# Patient Record
Sex: Male | Born: 1965 | Race: White | Hispanic: No | Marital: Married | Smoking: Never smoker
Health system: Southern US, Community
[De-identification: ages and names within clinical notes are randomized; demographics above are authoritative.]

## PROBLEM LIST (undated history)

## (undated) DIAGNOSIS — Z8719 Personal history of other diseases of the digestive system: Secondary | ICD-10-CM

## (undated) DIAGNOSIS — M1990 Unspecified osteoarthritis, unspecified site: Secondary | ICD-10-CM

## (undated) DIAGNOSIS — E785 Hyperlipidemia, unspecified: Secondary | ICD-10-CM

## (undated) DIAGNOSIS — K2270 Barrett's esophagus without dysplasia: Secondary | ICD-10-CM

## (undated) HISTORY — PX: INGUINAL HERNIA REPAIR: SHX194

## (undated) HISTORY — DX: Hyperlipidemia, unspecified: E78.5

## (undated) HISTORY — PX: KNEE SURGERY: SHX244

## (undated) HISTORY — PX: TONSILLECTOMY AND ADENOIDECTOMY: SUR1326

## (undated) HISTORY — PX: COLONOSCOPY: SHX174

## (undated) HISTORY — PX: OTHER SURGICAL HISTORY: SHX169

## (undated) HISTORY — DX: Barrett's esophagus without dysplasia: K22.70

---

## 2004-09-29 ENCOUNTER — Ambulatory Visit: Payer: Self-pay | Admitting: Internal Medicine

## 2004-11-03 ENCOUNTER — Ambulatory Visit: Payer: Self-pay | Admitting: Internal Medicine

## 2007-09-11 ENCOUNTER — Ambulatory Visit: Payer: Self-pay | Admitting: Family Medicine

## 2007-10-15 ENCOUNTER — Ambulatory Visit: Payer: Self-pay | Admitting: Internal Medicine

## 2007-10-17 LAB — CONVERTED CEMR LAB
Mumps IgG: 1.58 — ABNORMAL HIGH
Rubeola IgG: 2.51 — ABNORMAL HIGH

## 2007-10-18 ENCOUNTER — Encounter: Payer: Self-pay | Admitting: Internal Medicine

## 2007-10-18 ENCOUNTER — Telehealth (INDEPENDENT_AMBULATORY_CARE_PROVIDER_SITE_OTHER): Payer: Self-pay | Admitting: *Deleted

## 2007-10-22 ENCOUNTER — Encounter (INDEPENDENT_AMBULATORY_CARE_PROVIDER_SITE_OTHER): Payer: Self-pay | Admitting: *Deleted

## 2007-10-22 LAB — CONVERTED CEMR LAB: Varicella IgG: 4.12 — ABNORMAL HIGH

## 2008-01-23 ENCOUNTER — Ambulatory Visit: Payer: Self-pay | Admitting: Internal Medicine

## 2008-01-23 DIAGNOSIS — R319 Hematuria, unspecified: Secondary | ICD-10-CM | POA: Insufficient documentation

## 2008-01-23 DIAGNOSIS — R1032 Left lower quadrant pain: Secondary | ICD-10-CM

## 2008-01-23 LAB — CONVERTED CEMR LAB
Ketones, urine, test strip: NEGATIVE
Nitrite: NEGATIVE
Specific Gravity, Urine: 1.01
Urobilinogen, UA: 0.2
pH: 6

## 2008-01-24 ENCOUNTER — Encounter: Payer: Self-pay | Admitting: Internal Medicine

## 2008-01-25 ENCOUNTER — Encounter: Payer: Self-pay | Admitting: Internal Medicine

## 2008-01-26 LAB — CONVERTED CEMR LAB
Basophils Absolute: 0.1 10*3/uL (ref 0.0–0.1)
Basophils Relative: 0.8 % (ref 0.0–3.0)
Eosinophils Absolute: 0.1 10*3/uL (ref 0.0–0.7)
Lymphocytes Relative: 40 % (ref 12.0–46.0)
MCHC: 34.7 g/dL (ref 30.0–36.0)
MCV: 87.9 fL (ref 78.0–100.0)
Neutrophils Relative %: 48.6 % (ref 43.0–77.0)
Platelets: 258 10*3/uL (ref 150–400)
RBC: 4.88 M/uL (ref 4.22–5.81)
RDW: 11.8 % (ref 11.5–14.6)

## 2008-01-29 ENCOUNTER — Encounter (INDEPENDENT_AMBULATORY_CARE_PROVIDER_SITE_OTHER): Payer: Self-pay | Admitting: *Deleted

## 2008-01-31 ENCOUNTER — Encounter (INDEPENDENT_AMBULATORY_CARE_PROVIDER_SITE_OTHER): Payer: Self-pay | Admitting: *Deleted

## 2008-01-31 LAB — CONVERTED CEMR LAB: Varicella IgG: 3.47 — ABNORMAL HIGH

## 2008-02-20 ENCOUNTER — Telehealth: Payer: Self-pay | Admitting: Internal Medicine

## 2008-02-22 ENCOUNTER — Ambulatory Visit: Payer: Self-pay | Admitting: Internal Medicine

## 2008-02-27 ENCOUNTER — Encounter (INDEPENDENT_AMBULATORY_CARE_PROVIDER_SITE_OTHER): Payer: Self-pay | Admitting: *Deleted

## 2008-03-17 ENCOUNTER — Encounter: Payer: Self-pay | Admitting: Internal Medicine

## 2008-03-20 ENCOUNTER — Encounter: Admission: RE | Admit: 2008-03-20 | Discharge: 2008-03-20 | Payer: Self-pay | Admitting: General Surgery

## 2008-03-24 ENCOUNTER — Encounter: Payer: Self-pay | Admitting: Internal Medicine

## 2008-06-25 ENCOUNTER — Encounter: Payer: Self-pay | Admitting: Internal Medicine

## 2008-11-03 ENCOUNTER — Ambulatory Visit: Payer: Self-pay | Admitting: Internal Medicine

## 2008-11-05 ENCOUNTER — Ambulatory Visit: Payer: Self-pay | Admitting: Internal Medicine

## 2009-08-28 ENCOUNTER — Encounter: Payer: Self-pay | Admitting: Internal Medicine

## 2009-09-03 ENCOUNTER — Encounter: Payer: Self-pay | Admitting: Internal Medicine

## 2009-09-21 ENCOUNTER — Encounter: Payer: Self-pay | Admitting: Internal Medicine

## 2009-09-28 ENCOUNTER — Encounter: Payer: Self-pay | Admitting: Internal Medicine

## 2010-02-04 NOTE — Letter (Signed)
Summary: [facility]  [facility]   Imported By: Lanelle Bal 09/15/2009 08:33:43  _____________________________________________________________________  External Attachment:    Type:   Image     Comment:   External Document

## 2010-02-04 NOTE — Letter (Signed)
Summary: [facility]   Imported By: Lanelle Bal 09/08/2009 09:57:45  _____________________________________________________________________  External Attachment:    Type:   Image     Comment:   External Document

## 2010-02-04 NOTE — Procedures (Signed)
Summary: [facility]  [facility]   Imported By: Lanelle Bal 10/14/2009 13:28:59  _____________________________________________________________________  External Attachment:    Type:   Image     Comment:   External Document

## 2010-02-04 NOTE — Letter (Signed)
Summary: [facility]  [facility]   Imported By: Lanelle Bal 10/01/2009 12:04:38  _____________________________________________________________________  External Attachment:    Type:   Image     Comment:   External Document

## 2010-02-11 ENCOUNTER — Encounter: Payer: Self-pay | Admitting: Internal Medicine

## 2010-02-11 ENCOUNTER — Ambulatory Visit: Payer: Self-pay | Admitting: Internal Medicine

## 2010-02-11 ENCOUNTER — Ambulatory Visit (INDEPENDENT_AMBULATORY_CARE_PROVIDER_SITE_OTHER): Payer: 59 | Admitting: Internal Medicine

## 2010-02-11 DIAGNOSIS — G479 Sleep disorder, unspecified: Secondary | ICD-10-CM

## 2010-02-18 NOTE — Assessment & Plan Note (Signed)
Summary: discuss  sleep apnea///sph   Vital Signs:  Patient profile:   45 year old male Weight:      231 pounds Temp:     98.2 degrees F oral Pulse rate:   76 / minute Resp:     15 per minute BP sitting:   120 / 76  (left arm)  Vitals Entered By: Jeremy Johann CMA (February 11, 2010 4:15 PM) CC: discuss sleep issue, Insomnia   CC:  discuss sleep issue and Insomnia.  History of Present Illness:      This is a 45 year old man who presents with  chronic sleep issues since childhood.  The patient reports frequent awakening ( 10-12 X /night), early awakening ( 30 min after going to sleep), nightmares (he awakens in a" panic" 2-3x every night  ), snoring, and daytime somnolence, but denies difficulty falling asleep, leg movements, and apnea noted by partner.  He has had  weight gain of 10# over past year. He has not been able to exercise due to Achilles tendon issues. He denies  am  headache, and depression.  Risk factors for insomnia include history of  sleep walking & talking in sleep. No past treatments  have been tried.  Current Medications (verified): 1)  Kapidex 60 Mg Cpdr (Dexlansoprazole) .Marland Kitchen.. 1 Once Daily  Allergies (verified): No Known Drug Allergies  Physical Exam  General:  well-nourished,in no acute distress; alert,appropriate and cooperative throughout examination Ears:  External ear exam shows no significant lesions or deformities.  Otoscopic examination reveals clear canals, tympanic membranes are intact bilaterally without bulging, retraction, inflammation or discharge. Hearing is grossly normal bilaterally. Nose:  External nasal examination shows no deformity or inflammation. Nasal mucosa are pink and moist without lesions or exudates. Mouth:  Oral mucosa and oropharynx without lesions or exudates.  Teeth in good repair. Oropharynx not crowded Neck:  No deformities, masses, or tenderness noted. Lungs:  Normal respiratory effort, chest expands symmetrically. Lungs are  clear to auscultation, no crackles or wheezes. Heart:  normal rate, regular rhythm, no gallop, no rub, no JVD, and grade 1 /6 systolic murmur.   Cervical Nodes:  No lymphadenopathy noted Axillary Nodes:  No palpable lymphadenopathy   Impression & Recommendations:  Problem # 1:  SLEEP DISORDER, CHRONIC (ICD-780.50)  clinicaly not classic Sleep Apnea  Orders: Neurology Referral (Neuro)  Complete Medication List: 1)  Kapidex 60 Mg Cpdr (Dexlansoprazole) .Marland Kitchen.. 1 once daily   Orders Added: 1)  Est. Patient Level III [16109] 2)  Neurology Referral [Neuro]

## 2010-04-05 ENCOUNTER — Encounter: Payer: Self-pay | Admitting: Internal Medicine

## 2010-06-13 ENCOUNTER — Observation Stay (HOSPITAL_COMMUNITY)
Admission: EM | Admit: 2010-06-13 | Discharge: 2010-06-14 | Disposition: A | Discharge status: Home / Self Care | Payer: 59 | Attending: Emergency Medicine | Admitting: Emergency Medicine

## 2010-06-13 ENCOUNTER — Emergency Department (HOSPITAL_COMMUNITY): Payer: 59

## 2010-06-13 DIAGNOSIS — R079 Chest pain, unspecified: Principal | ICD-10-CM | POA: Insufficient documentation

## 2010-06-13 DIAGNOSIS — R110 Nausea: Secondary | ICD-10-CM | POA: Insufficient documentation

## 2010-06-13 DIAGNOSIS — R0602 Shortness of breath: Secondary | ICD-10-CM | POA: Insufficient documentation

## 2010-06-13 LAB — CBC
HCT: 39.6 % (ref 39.0–52.0)
Platelets: 230 10*3/uL (ref 150–400)
RDW: 12.5 % (ref 11.5–15.5)
WBC: 5.9 10*3/uL (ref 4.0–10.5)

## 2010-06-13 LAB — BASIC METABOLIC PANEL
Calcium: 8.7 mg/dL (ref 8.4–10.5)
Creatinine, Ser: 0.86 mg/dL (ref 0.4–1.5)
GFR calc Af Amer: >60 mL/min (ref >60)
GFR calc non Af Amer: >60 mL/min (ref >60)
Sodium: 138 mEq/L (ref 135–145)

## 2010-06-13 LAB — TROPONIN I: Troponin I: <0.3 ng/mL (ref <0.30)

## 2010-06-13 LAB — CK TOTAL AND CKMB (NOT AT ARMC)
Relative Index: 1.7 (ref 0.0–2.5)
Total CK: 152 U/L (ref 7–232)

## 2010-06-13 LAB — DIFFERENTIAL
Basophils Absolute: 0 10*3/uL (ref 0.0–0.1)
Eosinophils Absolute: 0.1 10*3/uL (ref 0.0–0.7)
Eosinophils Relative: 2 % (ref 0–5)
Lymphocytes Relative: 41 % (ref 12–46)

## 2010-06-13 MED ORDER — IOHEXOL 350 MG/ML SOLN
100.0000 mL | Freq: Once | INTRAVENOUS | Status: AC | PRN
  Filled 2010-06-13: qty 100

## 2010-06-14 ENCOUNTER — Telehealth: Payer: Self-pay | Admitting: *Deleted

## 2010-06-14 DIAGNOSIS — R072 Precordial pain: Secondary | ICD-10-CM

## 2010-06-14 LAB — TROPONIN I: Troponin I: <0.3 ng/mL (ref <0.30)

## 2010-06-14 LAB — CK TOTAL AND CKMB (NOT AT ARMC)
CK, MB: 2.3 ng/mL (ref 0.3–4.0)
Total CK: 130 U/L (ref 7–232)

## 2010-06-14 NOTE — Telephone Encounter (Signed)
[facility] Triage Call Report Triage Record Num: [id number] Operator: Di Kindle Patient Name: Randy Arnold Call Date & Time: 06/13/2010 2:58:54PM Patient Phone: [phone] PCP: Marga Melnick Patient Gender: Male PCP Fax : [phone] Patient DOB: September 26, 1965 Practice Name: [facility] Reason for Call: Emergenct call, pt calling to report onset 06/12/10 of chest pain, while playing basketball , had some chest pains, radiates to shoulder, feels he still has a headacehe, sl nauseated, still light headed and dizzy. Per CSR " the pt refused 911", Guideline: Chest Pain, family history of CAD, advised needs to be seen in ED: [facility]. Protocol(s) Used: Chest Pain Recommended Outcome per Protocol: See ED Immediately Reason for Outcome: Cardiac symptoms within last 24 hours AND known coronary artery disease (history of MI, angina, PCI or cardiac surgery) Care Advice: ~ Another adult should drive. A pattern of pain that has changed or feels different (more frequent, more severe, now caused

## 2010-06-14 NOTE — Telephone Encounter (Signed)
Left message to call office to check status of Pt. Pt seen in ED on 06-13-10

## 2010-06-15 NOTE — Telephone Encounter (Addendum)
Pt was seen in ED and stayed over night for observation. Pt had cardiac workup while in hosp. Pt notes that he still does not feel 100%. Pt has hosp f/u schedule for Thursday with Dr hopper.

## 2010-06-17 ENCOUNTER — Encounter: Payer: Self-pay | Admitting: Internal Medicine

## 2010-06-17 ENCOUNTER — Ambulatory Visit (INDEPENDENT_AMBULATORY_CARE_PROVIDER_SITE_OTHER): Payer: 59 | Admitting: Internal Medicine

## 2010-06-17 DIAGNOSIS — K2270 Barrett's esophagus without dysplasia: Secondary | ICD-10-CM

## 2010-06-17 DIAGNOSIS — R079 Chest pain, unspecified: Secondary | ICD-10-CM

## 2010-06-17 MED ORDER — TRAMADOL HCL 50 MG PO TABS: 50.0000 mg | ORAL_TABLET | Freq: Four times a day (QID) | ORAL | Status: AC | PRN

## 2010-06-17 NOTE — Patient Instructions (Addendum)
The triggers for dyspepsia or "heart burn"  include stress; the "aspirin family" ; alcohol; peppermint; and caffeine (coffee, tea, cola, and chocolate). The aspirin family would include aspirin and the nonsteroidal agents such as ibuprofen &  Naproxen. Tylenol would not cause reflux. If having dyspepsia ; food & dink should be avoided for @ least 2 hors before going to bed.  Stop Aleve

## 2010-06-17 NOTE — Progress Notes (Signed)
  Subjective:    Patient ID: Randy Arnold, male    DOB: Dec 05, 1965, 45 y.o.   MRN: [id number]  HPI CHEST PAIN  Location: SS  Quality: dull  Duration:constant since 6/9  Onset (rest, exertion): while playing BB Radiation: to back intrascapularly  Better with: no reliever (Note : he takes 4-5 Aleve every 48 hrs)  Worse with: no trigger  Symptoms  Nausea/vomiting: no  Diaphoresis: no  Shortness of breath: yes  Pleuritic: yes  Cough: no  Edema: no  Orthopnea: no  PND: no  Dizziness: no  Palpitations: no  Syncope: no  Indigestion: no   Red Flags Worse with exertion: yes, he had to stop playing BB  Recent Immobility: no  Tearing/radiation to back: yes, as dull discomfort Stress ECHO neg 6/11    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  Eyes: No conjunctival inflammation or scleral icterus is present.  Oral exam: Dental hygiene is good; lips and gums are healthy appearing.There is no oropharyngeal erythema or exudate noted.   Heart:  Normal rate and regular rhythm. S1 and S2 normal without gallop,  click, rub or other extra sounds  . Grade 1/2 systolic murmur   Lungs:Chest clear to auscultation; no wheezes, rhonchi,rales ,or rubs present.No increased work of breathing.   Abdomen: bowel sounds normal, soft and non-tender without masses, organomegaly or hernias noted.  No guarding or rebound   Skin:Warm & dry.  Intact without suspicious lesions or rashes ; no jaundice or tenting  Lymphatic: No lymphadenopathy is noted about the head, neck, axilla, or inguinal areas.  Extremities:  No cyanosis, edema, or deformity noted ;Homan's negative           Assessment & Plan:  #1 chest pain, ? Esophageal spasm exacerbated by NSAIDS; ? Role of HH Plan: see  Orders

## 2010-07-16 ENCOUNTER — Telehealth: Payer: Self-pay

## 2010-07-16 MED ORDER — ZOLPIDEM TARTRATE 10 MG PO TABS: ORAL_TABLET | ORAL | Status: DC

## 2010-07-16 NOTE — Telephone Encounter (Signed)
Patient's wife stopped by the office today and indicated her and husband will travel out of the country soon and would like rx for sleep aid for the plane ride, Dr.Hopper please advise. [facility] [place]

## 2012-08-27 HISTORY — PX: UPPER GI ENDOSCOPY: SHX6162

## 2012-09-09 ENCOUNTER — Encounter: Payer: Self-pay | Admitting: Internal Medicine

## 2013-09-30 ENCOUNTER — Telehealth: Payer: Self-pay | Admitting: Internal Medicine

## 2013-09-30 NOTE — Telephone Encounter (Signed)
Called pt back wanted to know the date of last MMR & varcella. Inform pt no varcella was done. Left copy of immunization record for pick-up...Randy Arnold

## 2013-09-30 NOTE — Telephone Encounter (Signed)
Patient is needing verification of vaccinations for [facility].

## 2013-10-15 ENCOUNTER — Ambulatory Visit (INDEPENDENT_AMBULATORY_CARE_PROVIDER_SITE_OTHER): Payer: Managed Care, Other (non HMO) | Admitting: Internal Medicine

## 2013-10-15 ENCOUNTER — Other Ambulatory Visit (INDEPENDENT_AMBULATORY_CARE_PROVIDER_SITE_OTHER): Payer: Managed Care, Other (non HMO)

## 2013-10-15 ENCOUNTER — Other Ambulatory Visit: Payer: Self-pay | Admitting: Internal Medicine

## 2013-10-15 ENCOUNTER — Encounter: Payer: Self-pay | Admitting: Internal Medicine

## 2013-10-15 VITALS — BP 104/80 | HR 63 | Temp 98.7°F | Resp 12 | Wt 228.2 lb

## 2013-10-15 DIAGNOSIS — Z0000 Encounter for general adult medical examination without abnormal findings: Secondary | ICD-10-CM

## 2013-10-15 DIAGNOSIS — Z0189 Encounter for other specified special examinations: Secondary | ICD-10-CM

## 2013-10-15 DIAGNOSIS — Z23 Encounter for immunization: Secondary | ICD-10-CM

## 2013-10-15 DIAGNOSIS — E785 Hyperlipidemia, unspecified: Secondary | ICD-10-CM

## 2013-10-15 LAB — CBC WITH DIFFERENTIAL/PLATELET
BASOS ABS: 0 10*3/uL (ref 0.0–0.1)
Basophils Relative: 0.5 % (ref 0.0–3.0)
EOS ABS: 0.1 10*3/uL (ref 0.0–0.7)
Eosinophils Relative: 2 % (ref 0.0–5.0)
HCT: 46.9 % (ref 39.0–52.0)
Hemoglobin: 15.8 g/dL (ref 13.0–17.0)
LYMPHS PCT: 34.3 % (ref 12.0–46.0)
Lymphs Abs: 2.3 10*3/uL (ref 0.7–4.0)
MCHC: 33.7 g/dL (ref 30.0–36.0)
MCV: 87.9 fl (ref 78.0–100.0)
MONO ABS: 0.6 10*3/uL (ref 0.1–1.0)
Monocytes Relative: 9.4 % (ref 3.0–12.0)
NEUTROS ABS: 3.6 10*3/uL (ref 1.4–7.7)
NEUTROS PCT: 53.8 % (ref 43.0–77.0)
Platelets: 285 10*3/uL (ref 150.0–400.0)
RBC: 5.34 Mil/uL (ref 4.22–5.81)
RDW: 13.3 % (ref 11.5–15.5)
WBC: 6.7 10*3/uL (ref 4.0–10.5)

## 2013-10-15 LAB — HEPATIC FUNCTION PANEL
ALBUMIN: 3.8 g/dL (ref 3.5–5.2)
ALK PHOS: 59 U/L (ref 39–117)
ALT: 29 U/L (ref 0–53)
AST: 20 U/L (ref 0–37)
Bilirubin, Direct: 0.1 mg/dL (ref 0.0–0.3)
TOTAL PROTEIN: 7.7 g/dL (ref 6.0–8.3)
Total Bilirubin: 0.7 mg/dL (ref 0.2–1.2)

## 2013-10-15 LAB — BASIC METABOLIC PANEL
BUN: 16 mg/dL (ref 6–23)
CALCIUM: 9.4 mg/dL (ref 8.4–10.5)
CHLORIDE: 102 meq/L (ref 96–112)
CO2: 29 meq/L (ref 19–32)
CREATININE: 1.1 mg/dL (ref 0.4–1.5)
GFR: 78.37 mL/min (ref >60.00)
GLUCOSE: 107 mg/dL — AB (ref 70–99)
Potassium: 4.4 mEq/L (ref 3.5–5.1)
Sodium: 139 mEq/L (ref 135–145)

## 2013-10-15 LAB — TSH: TSH: 2.14 u[IU]/mL (ref 0.35–4.50)

## 2013-10-15 NOTE — Patient Instructions (Signed)
Your next office appointment will be determined based upon review of your pending labs . Those instructions will be transmitted to you through My Chart  OR  by mail;whichever process is your choice to receive results & recommendations . 

## 2013-10-15 NOTE — Progress Notes (Signed)
   Subjective:    Patient ID: Randy Arnold, male    DOB: [birth year], 48 y.o.   MRN: [id number]  HPI  He is here for a physical;acute issues denied.  He is on a modified heart healthy diet. He exercises 3-5 times a week as basketball and running without symptoms.  His lipids have not been elevated in the recent past. No advanced panel on record. Paternal grandfather had heart attack at 6042.    Review of Systems Unexplained weight loss, abdominal pain, significant dyspepsia, dysphagia, melena, rectal bleeding, or persistently small caliber stools are denied despite PMH of Barrett's.  Chest pain, palpitations, tachycardia, exertional dyspnea, paroxysmal nocturnal dyspnea, claudication or edema are absent.       Objective:   Physical Exam Gen.: Healthy and well-nourished in appearance. Alert, appropriate and cooperative throughout exam. Head: Normocephalic without obvious abnormalities;  pattern alopecia  Eyes: No corneal or conjunctival inflammation noted. Pupils equal round reactive to light and accommodation. Extraocular motion intact.  Ears: External  ear exam reveals no significant lesions or deformities. Canals clear .TMs normal. Hearing is grossly normal bilaterally. Nose: External nasal exam reveals no deformity or inflammation. Nasal mucosa are pink and moist. No lesions or exudates noted.   Mouth: Oral mucosa and oropharynx reveal no lesions or exudates. Teeth in good repair. Neck: No deformities, masses, or tenderness noted. Range of motion & Thyroid normal. Lungs: Normal respiratory effort; chest expands symmetrically. Lungs are clear to auscultation without rales, wheezes, or increased work of breathing. Heart: Normal rate and rhythm. Normal S1 and S2. No gallop, click, or rub.No murmur. Abdomen: Bowel sounds normal; abdomen soft and nontender. No masses, organomegaly or hernias noted. Genitalia:  normal except for small left varices. Prostate is slightly broad  without  enlargement, asymmetry, nodularity, or induration .Dr Vernie Ammonsttelin has checked PSA                              Musculoskeletal/extremities: No deformity or scoliosis noted of  the thoracic or lumbar spine.  No clubbing, cyanosis, edema, or significant extremity  deformity noted. Range of motion normal .Tone & strength normal. Hand joints normal Fingernail  health good. Able to lie down & sit up w/o help. Negative SLR bilaterally Vascular: Carotid, radial artery, dorsalis pedis and  posterior tibial pulses are full and equal. No bruits present. Neurologic: Alert and oriented x3. Deep tendon reflexes symmetrical and normal.  Gait normal .       Skin: Intact without suspicious lesions or rashes. Lymph: No cervical, axillary, or inguinal lymphadenopathy present. Psych: Mood and affect are normal. Normally interactive                                                                                        Assessment & Plan:  #1 comprehensive physical exam; no acute findings  Plan: see Orders  & Recommendations

## 2013-10-15 NOTE — Progress Notes (Signed)
Pre visit review using our clinic review tool, if applicable. No additional management support is needed unless otherwise documented below in the visit note. 

## 2013-10-17 ENCOUNTER — Telehealth: Payer: Self-pay

## 2013-10-17 ENCOUNTER — Encounter: Payer: Self-pay | Admitting: *Deleted

## 2013-10-17 LAB — NMR LIPOPROFILE WITH LIPIDS
Cholesterol, Total: 267 mg/dL — ABNORMAL HIGH (ref 100–199)
HDL PARTICLE NUMBER: 32.5 umol/L (ref >=30.5)
HDL SIZE: 8.7 nm — AB (ref >=9.2)
HDL-C: 48 mg/dL (ref >39)
LDL CALC: 171 mg/dL — AB (ref 0–99)
LDL PARTICLE NUMBER: 2185 nmol/L — AB (ref <1000)
LDL SIZE: 20.7 nm (ref >=20.8)
LP-IR SCORE: 75 — AB (ref <=45)
Large HDL-P: 3.9 umol/L — ABNORMAL LOW (ref >=4.8)
Large VLDL-P: 10.1 nmol/L — ABNORMAL HIGH (ref <=2.7)
SMALL LDL PARTICLE NUMBER: 1314 nmol/L — AB (ref <=527)
Triglycerides: 240 mg/dL — ABNORMAL HIGH (ref 0–149)
VLDL SIZE: 49.3 nm — AB (ref <=46.6)

## 2013-10-17 LAB — TB SKIN TEST
Induration: 0 mm
TB Skin Test: NEGATIVE

## 2013-10-17 NOTE — Telephone Encounter (Signed)
Message copied by Noreene LarssonANDREWS, Braydee Shimkus R on Thu Oct 17, 2013  8:00 AM ------      Message from: Pecola LawlessHOPPER, WILLIAM F      Created: Wed Oct 16, 2013  5:41 PM       Please add A1c  ------

## 2013-10-17 NOTE — Telephone Encounter (Signed)
Request for add on has been sent to lab 

## 2013-10-18 ENCOUNTER — Other Ambulatory Visit (INDEPENDENT_AMBULATORY_CARE_PROVIDER_SITE_OTHER): Payer: Managed Care, Other (non HMO)

## 2013-10-18 DIAGNOSIS — R739 Hyperglycemia, unspecified: Secondary | ICD-10-CM

## 2013-10-18 LAB — HEMOGLOBIN A1C: HEMOGLOBIN A1C: 5.8 % (ref 4.6–6.5)

## 2013-10-20 ENCOUNTER — Encounter: Payer: Self-pay | Admitting: Internal Medicine

## 2013-10-23 ENCOUNTER — Other Ambulatory Visit: Payer: Self-pay

## 2013-10-23 ENCOUNTER — Encounter: Payer: Self-pay | Admitting: Internal Medicine

## 2013-10-23 ENCOUNTER — Other Ambulatory Visit: Payer: Self-pay | Admitting: Internal Medicine

## 2013-10-23 DIAGNOSIS — E785 Hyperlipidemia, unspecified: Secondary | ICD-10-CM

## 2013-10-23 MED ORDER — ATORVASTATIN CALCIUM 20 MG PO TABS: 20.0000 mg | ORAL_TABLET | Freq: Every day | ORAL | Status: DC

## 2014-03-11 ENCOUNTER — Other Ambulatory Visit: Payer: Self-pay | Admitting: Internal Medicine

## 2014-03-11 DIAGNOSIS — E785 Hyperlipidemia, unspecified: Secondary | ICD-10-CM

## 2014-03-12 ENCOUNTER — Other Ambulatory Visit: Payer: Self-pay | Admitting: Internal Medicine

## 2014-03-12 DIAGNOSIS — E785 Hyperlipidemia, unspecified: Secondary | ICD-10-CM

## 2014-03-12 MED ORDER — KETOCONAZOLE 200 MG PO TABS: 400.0000 mg | ORAL_TABLET | Freq: Every day | ORAL | Status: DC

## 2014-03-12 MED ORDER — ATORVASTATIN CALCIUM 20 MG PO TABS: 20.0000 mg | ORAL_TABLET | Freq: Every day | ORAL | Status: DC

## 2017-04-05 ENCOUNTER — Other Ambulatory Visit: Payer: Self-pay | Admitting: Orthopedic Surgery

## 2017-04-07 ENCOUNTER — Encounter (HOSPITAL_COMMUNITY): Payer: Self-pay

## 2017-04-07 NOTE — Pre-Procedure Instructions (Signed)
Randy Arnold  04/07/2017      [facility] [id number] [place] - [address] [facility] [address] [facility] [place] [address] Phone: [phone] Fax: [phone]  [facility] [facility] [address] [address] [place] - [address] [place] [address] Phone: [phone] Fax: [phone]    Your procedure is scheduled on April 11.  [facility].M.  Call this number if you have problems the morning of surgery:  [phone]   Remember:  Do not eat food or drink liquids after midnight.  Take these medicines the morning of surgery with A SIP OF WATER Lyrica   Stop taking aspirin. BC's, Goody's, Herbal medications, fish oil, Ibuprofen, Advil, Motrin, Aleve, VItamins    Do not wear jewelry, make-up or nail polish.  Do not wear lotions, powders, or perfumes, or deodorant.  Do not shave 48 hours prior to surgery.  Men may shave face and neck.  Do not bring valuables to the hospital.  [facility] is not responsible for any belongings or valuables.  Contacts, dentures or bridgework may not be worn into surgery.  Leave your suitcase in the car.  After surgery it may be brought to your room.  For patients admitted to the hospital, discharge time will be determined by your treatment team.  Patients discharged the day of surgery will not be allowed to drive home.    Special instructions:   [place]- Preparing For Surgery  Before surgery, you can play an important role. Because skin is not sterile, your skin needs to be as free of germs as possible. You can reduce the number of germs on your skin by washing with CHG (chlorahexidine gluconate) Soap before surgery.  CHG is an antiseptic cleaner which kills germs and bonds with the skin to continue killing germs even after washing.  Please do not use if you have an allergy to CHG or antibacterial soaps. If your skin  becomes reddened/irritated stop using the CHG.  Do not shave (including legs and underarms) for at least 48 hours prior to first CHG shower. It is OK to shave your face.  Please follow these instructions carefully.   1. Shower the NIGHT BEFORE SURGERY and the MORNING OF SURGERY with CHG.   2. If you chose to wash your hair, wash your hair first as usual with your normal shampoo.  3. After you shampoo, rinse your hair and body thoroughly to remove the shampoo.  4. Use CHG as you would any other liquid soap. You can apply CHG directly to the skin and wash gently with a scrungie or a clean washcloth.   5. Apply the CHG Soap to your body ONLY FROM THE NECK DOWN.  Do not use on open wounds or open sores. Avoid contact with your eyes, ears, mouth and genitals (private parts). Wash Face and genitals (private parts)  with your normal soap.  6. Wash thoroughly, paying special attention to the area where your surgery will be performed.  7. Thoroughly rinse your body with warm water from the neck down.  8. DO NOT shower/wash with your normal soap after using and rinsing off the CHG Soap.  9. Pat yourself dry with a CLEAN TOWEL.  10. Wear CLEAN PAJAMAS to bed the night before surgery, wear comfortable clothes the morning of surgery  11. Place CLEAN SHEETS on your bed  the night of your first shower and DO NOT SLEEP WITH PETS.    Day of Surgery: Do not apply any deodorants/lotions. Please wear clean clothes to the hospital/surgery center.      Please read over the following fact sheets that you were given. Pain Booklet, Coughing and Deep Breathing, MRSA Information and Surgical Site Infection Prevention

## 2017-04-10 ENCOUNTER — Ambulatory Visit (HOSPITAL_COMMUNITY)
Admission: RE | Admit: 2017-04-10 | Discharge: 2017-04-10 | Disposition: A | Discharge status: Home / Self Care | Payer: 59 | Source: Ambulatory Visit | Attending: Orthopedic Surgery | Admitting: Orthopedic Surgery

## 2017-04-10 ENCOUNTER — Other Ambulatory Visit: Payer: Self-pay

## 2017-04-10 ENCOUNTER — Encounter (HOSPITAL_COMMUNITY)
Admission: RE | Admit: 2017-04-10 | Discharge: 2017-04-10 | Disposition: A | Discharge status: Home / Self Care | Payer: 59 | Source: Ambulatory Visit | Attending: Orthopedic Surgery | Admitting: Orthopedic Surgery

## 2017-04-10 ENCOUNTER — Encounter (HOSPITAL_COMMUNITY): Payer: Self-pay

## 2017-04-10 DIAGNOSIS — Z01810 Encounter for preprocedural cardiovascular examination: Secondary | ICD-10-CM | POA: Diagnosis present

## 2017-04-10 DIAGNOSIS — Z01818 Encounter for other preprocedural examination: Secondary | ICD-10-CM | POA: Diagnosis present

## 2017-04-10 HISTORY — DX: Personal history of other diseases of the digestive system: Z87.19

## 2017-04-10 HISTORY — DX: Unspecified osteoarthritis, unspecified site: M19.90

## 2017-04-10 LAB — URINALYSIS, ROUTINE W REFLEX MICROSCOPIC
BACTERIA UA: NONE SEEN
Bilirubin Urine: NEGATIVE
Glucose, UA: NEGATIVE mg/dL
Ketones, ur: NEGATIVE mg/dL
Leukocytes, UA: NEGATIVE
Nitrite: NEGATIVE
Protein, ur: NEGATIVE mg/dL
SPECIFIC GRAVITY, URINE: 1.008 (ref 1.005–1.030)
SQUAMOUS EPITHELIAL / LPF: NONE SEEN
pH: 6 (ref 5.0–8.0)

## 2017-04-10 LAB — PROTIME-INR
INR: 0.95
Prothrombin Time: 12.6 seconds (ref 11.4–15.2)

## 2017-04-10 LAB — COMPREHENSIVE METABOLIC PANEL
ALBUMIN: 3.9 g/dL (ref 3.5–5.0)
ALT: 40 U/L (ref 17–63)
AST: 24 U/L (ref 15–41)
Alkaline Phosphatase: 58 U/L (ref 38–126)
Anion gap: 14 (ref 5–15)
BILIRUBIN TOTAL: 0.9 mg/dL (ref 0.3–1.2)
BUN: 13 mg/dL (ref 6–20)
CO2: 22 mmol/L (ref 22–32)
CREATININE: 0.98 mg/dL (ref 0.61–1.24)
Calcium: 9.4 mg/dL (ref 8.9–10.3)
Chloride: 101 mmol/L (ref 101–111)
GFR calc Af Amer: >60 mL/min (ref >60)
Glucose, Bld: 107 mg/dL — ABNORMAL HIGH (ref 65–99)
Potassium: 3.9 mmol/L (ref 3.5–5.1)
Sodium: 137 mmol/L (ref 135–145)
TOTAL PROTEIN: 7.1 g/dL (ref 6.5–8.1)

## 2017-04-10 LAB — CBC WITH DIFFERENTIAL/PLATELET
BASOS PCT: 1 %
Basophils Absolute: 0.1 10*3/uL (ref 0.0–0.1)
EOS PCT: 1 %
Eosinophils Absolute: 0.1 10*3/uL (ref 0.0–0.7)
HCT: 44.4 % (ref 39.0–52.0)
HEMOGLOBIN: 15 g/dL (ref 13.0–17.0)
Lymphocytes Relative: 40 %
Lymphs Abs: 2.9 10*3/uL (ref 0.7–4.0)
MCH: 29.8 pg (ref 26.0–34.0)
MCHC: 33.8 g/dL (ref 30.0–36.0)
MCV: 88.3 fL (ref 78.0–100.0)
MONOS PCT: 10 %
Monocytes Absolute: 0.7 10*3/uL (ref 0.1–1.0)
NEUTROS PCT: 48 %
Neutro Abs: 3.6 10*3/uL (ref 1.7–7.7)
PLATELETS: 297 10*3/uL (ref 150–400)
RBC: 5.03 MIL/uL (ref 4.22–5.81)
RDW: 12.9 % (ref 11.5–15.5)
WBC: 7.4 10*3/uL (ref 4.0–10.5)

## 2017-04-10 LAB — SURGICAL PCR SCREEN
MRSA, PCR: NEGATIVE
Staphylococcus aureus: NEGATIVE

## 2017-04-10 LAB — APTT: aPTT: 28 seconds (ref 24–36)

## 2017-04-10 NOTE — Progress Notes (Signed)
PCP: Dr. Antony HasteMichael Badger @ [facility] [place]

## 2017-04-12 ENCOUNTER — Encounter (HOSPITAL_COMMUNITY): Payer: Self-pay | Admitting: Anesthesiology

## 2017-04-12 NOTE — Anesthesia Preprocedure Evaluation (Addendum)
Anesthesia Evaluation  Patient identified by MRN, date of birth, ID band Patient awake    Reviewed: Allergy & Precautions, NPO status , Patient's Chart, lab work & pertinent test results  Airway Mallampati: II  TM Distance: >3 FB Neck ROM: Full    Dental  (+) Teeth Intact, Dental Advisory Given   Pulmonary neg pulmonary ROS,    breath sounds clear to auscultation       Cardiovascular  Rhythm:Regular Rate:Normal     Neuro/Psych negative neurological ROS  negative psych ROS   GI/Hepatic Neg liver ROS, hiatal hernia,   Endo/Other  negative endocrine ROS  Renal/GU negative Renal ROS     Musculoskeletal  (+) Arthritis ,   Abdominal Normal abdominal exam  (+)   Peds  Hematology negative hematology ROS (+)   Anesthesia Other Findings - HLD  Reproductive/Obstetrics                            Lab Results  Component Value Date   WBC 7.4 04/10/2017   HGB 15.0 04/10/2017   HCT 44.4 04/10/2017   MCV 88.3 04/10/2017   PLT 297 04/10/2017   Lab Results  Component Value Date   CREATININE 0.98 04/10/2017   BUN 13 04/10/2017   NA 137 04/10/2017   K 3.9 04/10/2017   CL 101 04/10/2017   CO2 22 04/10/2017   Lab Results  Component Value Date   INR 0.95 04/10/2017   EKG: NSR  Anesthesia Physical Anesthesia Plan  ASA: II  Anesthesia Plan: General   Post-op Pain Management:    Induction: Intravenous  PONV Risk Score and Plan: 3 and Ondansetron, Dexamethasone and Midazolam  Airway Management Planned: Oral ETT  Additional Equipment: None  Intra-op Plan:   Post-operative Plan: Extubation in OR  Informed Consent: I have reviewed the patients History and Physical, chart, labs and discussed the procedure including the risks, benefits and alternatives for the proposed anesthesia with the patient or authorized representative who has indicated his/her understanding and acceptance.   Dental  advisory given  Plan Discussed with: CRNA  Anesthesia Plan Comments:        Anesthesia Quick Evaluation

## 2017-04-13 ENCOUNTER — Ambulatory Visit (HOSPITAL_COMMUNITY): Payer: No Typology Code available for payment source

## 2017-04-13 ENCOUNTER — Ambulatory Visit (HOSPITAL_COMMUNITY): Payer: No Typology Code available for payment source | Admitting: Anesthesiology

## 2017-04-13 ENCOUNTER — Encounter (HOSPITAL_COMMUNITY): Payer: Self-pay | Admitting: *Deleted

## 2017-04-13 ENCOUNTER — Ambulatory Visit (HOSPITAL_COMMUNITY)
Admission: RE | Admit: 2017-04-13 | Payer: No Typology Code available for payment source | Source: Ambulatory Visit | Admitting: Orthopedic Surgery

## 2017-04-13 ENCOUNTER — Ambulatory Visit (HOSPITAL_COMMUNITY)
Admission: RE | Admit: 2017-04-13 | Discharge: 2017-04-13 | Disposition: A | Discharge status: Home / Self Care | Payer: No Typology Code available for payment source | Source: Ambulatory Visit | Attending: Orthopedic Surgery | Admitting: Orthopedic Surgery

## 2017-04-13 ENCOUNTER — Ambulatory Visit (HOSPITAL_COMMUNITY)
Admission: RE | Disposition: A | Discharge status: Home / Self Care | Payer: Self-pay | Source: Ambulatory Visit | Attending: Orthopedic Surgery

## 2017-04-13 DIAGNOSIS — Z79891 Long term (current) use of opiate analgesic: Secondary | ICD-10-CM | POA: Diagnosis not present

## 2017-04-13 DIAGNOSIS — E785 Hyperlipidemia, unspecified: Secondary | ICD-10-CM | POA: Diagnosis not present

## 2017-04-13 DIAGNOSIS — M5116 Intervertebral disc disorders with radiculopathy, lumbar region: Secondary | ICD-10-CM | POA: Diagnosis not present

## 2017-04-13 DIAGNOSIS — Z79899 Other long term (current) drug therapy: Secondary | ICD-10-CM | POA: Insufficient documentation

## 2017-04-13 DIAGNOSIS — Z419 Encounter for procedure for purposes other than remedying health state, unspecified: Secondary | ICD-10-CM

## 2017-04-13 HISTORY — PX: LUMBAR LAMINECTOMY/DECOMPRESSION MICRODISCECTOMY: SHX5026

## 2017-04-13 SURGERY — LUMBAR LAMINECTOMY/DECOMPRESSION MICRODISCECTOMY
Anesthesia: General | Laterality: Left

## 2017-04-13 MED ORDER — CEFAZOLIN SODIUM-DEXTROSE 2-3 GM-%(50ML) IV SOLR
INTRAVENOUS | Status: DC | PRN
  Administered 2017-04-13: 2 g via INTRAVENOUS

## 2017-04-13 MED ORDER — PHENYLEPHRINE 40 MCG/ML (10ML) SYRINGE FOR IV PUSH (FOR BLOOD PRESSURE SUPPORT)
PREFILLED_SYRINGE | INTRAVENOUS | Status: AC
  Filled 2017-04-13: qty 20

## 2017-04-13 MED ORDER — ONDANSETRON HCL 4 MG/2ML IJ SOLN
INTRAMUSCULAR | Status: DC | PRN
  Administered 2017-04-13: 4 mg via INTRAVENOUS

## 2017-04-13 MED ORDER — BUPIVACAINE LIPOSOME 1.3 % IJ SUSP
20.0000 mL | INTRAMUSCULAR | Status: AC
  Administered 2017-04-13: 20 mL
  Filled 2017-04-13: qty 20

## 2017-04-13 MED ORDER — ROCURONIUM BROMIDE 10 MG/ML (PF) SYRINGE
PREFILLED_SYRINGE | INTRAVENOUS | Status: AC
  Filled 2017-04-13: qty 5

## 2017-04-13 MED ORDER — METHYLPREDNISOLONE ACETATE 40 MG/ML IJ SUSP
INTRAMUSCULAR | Status: DC | PRN
  Administered 2017-04-13: 2 mL
  Administered 2017-04-13: 40 mg

## 2017-04-13 MED ORDER — SUCCINYLCHOLINE CHLORIDE 200 MG/10ML IV SOSY
PREFILLED_SYRINGE | INTRAVENOUS | Status: AC
  Filled 2017-04-13: qty 10

## 2017-04-13 MED ORDER — ONDANSETRON HCL 4 MG/2ML IJ SOLN
INTRAMUSCULAR | Status: AC
  Filled 2017-04-13: qty 2

## 2017-04-13 MED ORDER — SUGAMMADEX SODIUM 200 MG/2ML IV SOLN
INTRAVENOUS | Status: DC | PRN
  Administered 2017-04-13: 212.2 mg via INTRAVENOUS

## 2017-04-13 MED ORDER — SUGAMMADEX SODIUM 200 MG/2ML IV SOLN
INTRAVENOUS | Status: AC
  Filled 2017-04-13: qty 2

## 2017-04-13 MED ORDER — METHYLPREDNISOLONE ACETATE 40 MG/ML IJ SUSP
INTRAMUSCULAR | Status: AC
  Filled 2017-04-13: qty 1

## 2017-04-13 MED ORDER — HYDROMORPHONE HCL 1 MG/ML IJ SOLN: 0.2500 mg | INTRAMUSCULAR | Status: DC | PRN

## 2017-04-13 MED ORDER — MEPERIDINE HCL 50 MG/ML IJ SOLN: 6.2500 mg | INTRAMUSCULAR | Status: DC | PRN

## 2017-04-13 MED ORDER — DEXAMETHASONE SODIUM PHOSPHATE 10 MG/ML IJ SOLN
INTRAMUSCULAR | Status: AC
  Filled 2017-04-13: qty 1

## 2017-04-13 MED ORDER — THROMBIN 20000 UNITS EX SOLR
CUTANEOUS | Status: AC
  Filled 2017-04-13: qty 20000

## 2017-04-13 MED ORDER — MIDAZOLAM HCL 2 MG/2ML IJ SOLN
INTRAMUSCULAR | Status: AC
  Filled 2017-04-13: qty 2

## 2017-04-13 MED ORDER — METHYLENE BLUE 0.5 % INJ SOLN
INTRAVENOUS | Status: AC
  Filled 2017-04-13: qty 10

## 2017-04-13 MED ORDER — CEFAZOLIN SODIUM-DEXTROSE 2-4 GM/100ML-% IV SOLN
INTRAVENOUS | Status: AC
  Filled 2017-04-13: qty 100

## 2017-04-13 MED ORDER — FENTANYL CITRATE (PF) 250 MCG/5ML IJ SOLN
INTRAMUSCULAR | Status: AC
  Filled 2017-04-13: qty 5

## 2017-04-13 MED ORDER — PROPOFOL 10 MG/ML IV BOLUS
INTRAVENOUS | Status: DC | PRN
  Administered 2017-04-13: 200 mg via INTRAVENOUS

## 2017-04-13 MED ORDER — 0.9 % SODIUM CHLORIDE (POUR BTL) OPTIME
TOPICAL | Status: DC | PRN
  Administered 2017-04-13: 1000 mL

## 2017-04-13 MED ORDER — EPHEDRINE 5 MG/ML INJ
INTRAVENOUS | Status: AC
  Filled 2017-04-13: qty 10

## 2017-04-13 MED ORDER — LACTATED RINGERS IV SOLN: INTRAVENOUS | Status: DC

## 2017-04-13 MED ORDER — THROMBIN (RECOMBINANT) 20000 UNITS EX SOLR
CUTANEOUS | Status: DC | PRN
  Administered 2017-04-13: 10:00:00 via TOPICAL

## 2017-04-13 MED ORDER — LIDOCAINE HCL (CARDIAC) 20 MG/ML IV SOLN
INTRAVENOUS | Status: DC | PRN
  Administered 2017-04-13: 40 mg via INTRAVENOUS

## 2017-04-13 MED ORDER — LACTATED RINGERS IV SOLN
INTRAVENOUS | Status: DC
  Administered 2017-04-13 (×2): via INTRAVENOUS

## 2017-04-13 MED ORDER — LIDOCAINE 2% (20 MG/ML) 5 ML SYRINGE
INTRAMUSCULAR | Status: AC
  Filled 2017-04-13: qty 5

## 2017-04-13 MED ORDER — HEMOSTATIC AGENTS (NO CHARGE) OPTIME
TOPICAL | Status: DC | PRN
  Administered 2017-04-13 (×2): 1 via TOPICAL

## 2017-04-13 MED ORDER — DEXAMETHASONE SODIUM PHOSPHATE 10 MG/ML IJ SOLN
INTRAMUSCULAR | Status: DC | PRN
  Administered 2017-04-13: 10 mg via INTRAVENOUS

## 2017-04-13 MED ORDER — HEMOSTATIC AGENTS (NO CHARGE) OPTIME
TOPICAL | Status: DC | PRN
  Administered 2017-04-13: 1 via TOPICAL

## 2017-04-13 MED ORDER — BUPIVACAINE-EPINEPHRINE 0.25% -1:200000 IJ SOLN
INTRAMUSCULAR | Status: AC
  Filled 2017-04-13: qty 1

## 2017-04-13 MED ORDER — ROCURONIUM BROMIDE 10 MG/ML (PF) SYRINGE
PREFILLED_SYRINGE | INTRAVENOUS | Status: AC
  Filled 2017-04-13: qty 20

## 2017-04-13 MED ORDER — FENTANYL CITRATE (PF) 100 MCG/2ML IJ SOLN
INTRAMUSCULAR | Status: DC | PRN
  Administered 2017-04-13 (×2): 100 ug via INTRAVENOUS
  Administered 2017-04-13: 50 ug via INTRAVENOUS

## 2017-04-13 MED ORDER — PROPOFOL 10 MG/ML IV BOLUS
INTRAVENOUS | Status: AC
  Filled 2017-04-13: qty 40

## 2017-04-13 MED ORDER — ROCURONIUM BROMIDE 100 MG/10ML IV SOLN
INTRAVENOUS | Status: DC | PRN
  Administered 2017-04-13: 60 mg via INTRAVENOUS
  Administered 2017-04-13 (×2): 20 mg via INTRAVENOUS
  Administered 2017-04-13: 10 mg via INTRAVENOUS

## 2017-04-13 MED ORDER — MIDAZOLAM HCL 5 MG/5ML IJ SOLN
INTRAMUSCULAR | Status: DC | PRN
  Administered 2017-04-13: 2 mg via INTRAVENOUS

## 2017-04-13 MED ORDER — PROMETHAZINE HCL 25 MG/ML IJ SOLN: 6.2500 mg | INTRAMUSCULAR | Status: DC | PRN

## 2017-04-13 MED ORDER — PHENYLEPHRINE 40 MCG/ML (10ML) SYRINGE FOR IV PUSH (FOR BLOOD PRESSURE SUPPORT)
PREFILLED_SYRINGE | INTRAVENOUS | Status: AC
  Filled 2017-04-13: qty 10

## 2017-04-13 MED ORDER — BUPIVACAINE-EPINEPHRINE 0.25% -1:200000 IJ SOLN
INTRAMUSCULAR | Status: DC | PRN
  Administered 2017-04-13: 10 mL
  Administered 2017-04-13: 9 mL

## 2017-04-13 SURGICAL SUPPLY — 80 items
AGENT HMST MTR 8 SURGIFLO (HEMOSTASIS) ×1
APL SKNCLS STERI-STRIP NONHPOA (GAUZE/BANDAGES/DRESSINGS)
BENZOIN TINCTURE PRP APPL 2/3 (GAUZE/BANDAGES/DRESSINGS) IMPLANT
BUR ROUND PRECISION 4.0 (BURR) ×2 IMPLANT
BUR ROUND PRECISION 4.0MM (BURR) ×1
CANISTER SUCT 3000ML PPV (MISCELLANEOUS) ×3 IMPLANT
CARTRIDGE OIL MAESTRO DRILL (MISCELLANEOUS) ×1 IMPLANT
CLOSURE WOUND 1/2 X4 (GAUZE/BANDAGES/DRESSINGS) ×1
CORDS BIPOLAR (ELECTRODE) ×3 IMPLANT
COVER SURGICAL LIGHT HANDLE (MISCELLANEOUS) ×3 IMPLANT
DIFFUSER DRILL AIR PNEUMATIC (MISCELLANEOUS) ×3 IMPLANT
DRAIN CHANNEL 15F RND FF W/TCR (WOUND CARE) IMPLANT
DRAPE POUCH INSTRU U-SHP 10X18 (DRAPES) ×6 IMPLANT
DRAPE SURG 17X23 STRL (DRAPES) ×12 IMPLANT
DURAPREP 26ML APPLICATOR (WOUND CARE) ×3 IMPLANT
ELECT BLADE 4.0 EZ CLEAN MEGAD (MISCELLANEOUS) ×3
ELECT CAUTERY BLADE 6.4 (BLADE) ×3 IMPLANT
ELECT REM PT RETURN 9FT ADLT (ELECTROSURGICAL) ×3
ELECTRODE BLDE 4.0 EZ CLN MEGD (MISCELLANEOUS) ×1 IMPLANT
ELECTRODE REM PT RTRN 9FT ADLT (ELECTROSURGICAL) ×1 IMPLANT
EVACUATOR SILICONE 100CC (DRAIN) IMPLANT
FILTER STRAW FLUID ASPIR (MISCELLANEOUS) ×3 IMPLANT
GAUZE SPONGE 4X4 12PLY STRL (GAUZE/BANDAGES/DRESSINGS) ×3 IMPLANT
GAUZE SPONGE 4X4 16PLY XRAY LF (GAUZE/BANDAGES/DRESSINGS) ×6 IMPLANT
GLOVE BIO SURGEON STRL SZ7 (GLOVE) ×3 IMPLANT
GLOVE BIO SURGEON STRL SZ8 (GLOVE) ×3 IMPLANT
GLOVE BIOGEL PI IND STRL 6.5 (GLOVE) IMPLANT
GLOVE BIOGEL PI IND STRL 7.0 (GLOVE) ×1 IMPLANT
GLOVE BIOGEL PI IND STRL 8 (GLOVE) ×1 IMPLANT
GLOVE BIOGEL PI INDICATOR 6.5 (GLOVE) ×8
GLOVE BIOGEL PI INDICATOR 7.0 (GLOVE) ×2
GLOVE BIOGEL PI INDICATOR 8 (GLOVE) ×2
GLOVE SURG SS PI 6.0 STRL IVOR (GLOVE) ×4 IMPLANT
GOWN STRL REUS W/ TWL LRG LVL3 (GOWN DISPOSABLE) ×1 IMPLANT
GOWN STRL REUS W/ TWL XL LVL3 (GOWN DISPOSABLE) ×2 IMPLANT
GOWN STRL REUS W/TWL LRG LVL3 (GOWN DISPOSABLE) ×3
GOWN STRL REUS W/TWL XL LVL3 (GOWN DISPOSABLE) ×6
IV CATH 14GX2 1/4 (CATHETERS) ×3 IMPLANT
KIT BASIN OR (CUSTOM PROCEDURE TRAY) ×3 IMPLANT
KIT POSITION SURG JACKSON T1 (MISCELLANEOUS) ×3 IMPLANT
KIT TURNOVER KIT B (KITS) ×3 IMPLANT
NDL 18GX1X1/2 (RX/OR ONLY) (NEEDLE) ×1 IMPLANT
NDL HYPO 25GX1X1/2 BEV (NEEDLE) ×1 IMPLANT
NDL SPNL 18GX3.5 QUINCKE PK (NEEDLE) ×2 IMPLANT
NEEDLE 18GX1X1/2 (RX/OR ONLY) (NEEDLE) ×3 IMPLANT
NEEDLE 22X1 1/2 (OR ONLY) (NEEDLE) ×3 IMPLANT
NEEDLE HYPO 25GX1X1/2 BEV (NEEDLE) ×3 IMPLANT
NEEDLE SPNL 18GX3.5 QUINCKE PK (NEEDLE) ×6 IMPLANT
NS IRRIG 1000ML POUR BTL (IV SOLUTION) ×3 IMPLANT
OIL CARTRIDGE MAESTRO DRILL (MISCELLANEOUS) ×3
PACK LAMINECTOMY ORTHO (CUSTOM PROCEDURE TRAY) ×3 IMPLANT
PACK UNIVERSAL I (CUSTOM PROCEDURE TRAY) ×3 IMPLANT
PAD ARMBOARD 7.5X6 YLW CONV (MISCELLANEOUS) ×6 IMPLANT
PATTIES SURGICAL .5 X.5 (GAUZE/BANDAGES/DRESSINGS) IMPLANT
PATTIES SURGICAL .5 X1 (DISPOSABLE) ×3 IMPLANT
SPOGE SURGIFLO 8M (HEMOSTASIS) ×2
SPONGE INTESTINAL PEANUT (DISPOSABLE) ×3 IMPLANT
SPONGE SURGIFLO 8M (HEMOSTASIS) IMPLANT
SPONGE SURGIFOAM ABS GEL 100 (HEMOSTASIS) ×3 IMPLANT
SPONGE SURGIFOAM ABS GEL SZ50 (HEMOSTASIS) ×3 IMPLANT
STRIP CLOSURE SKIN 1/2X4 (GAUZE/BANDAGES/DRESSINGS) ×1 IMPLANT
SURGIFLO W/THROMBIN 8M KIT (HEMOSTASIS) ×2 IMPLANT
SUT MNCRL AB 4-0 PS2 18 (SUTURE) ×3 IMPLANT
SUT VIC AB 0 CT1 18XCR BRD 8 (SUTURE) IMPLANT
SUT VIC AB 0 CT1 27 (SUTURE)
SUT VIC AB 0 CT1 27XBRD ANBCTR (SUTURE) IMPLANT
SUT VIC AB 0 CT1 8-18 (SUTURE)
SUT VIC AB 1 CT1 18XCR BRD 8 (SUTURE) ×1 IMPLANT
SUT VIC AB 1 CT1 8-18 (SUTURE) ×3
SUT VIC AB 2-0 CT2 18 VCP726D (SUTURE) ×3 IMPLANT
SYR 20CC LL (SYRINGE) ×3 IMPLANT
SYR BULB IRRIGATION 50ML (SYRINGE) ×3 IMPLANT
SYR CONTROL 10ML LL (SYRINGE) ×6 IMPLANT
SYR TB 1ML 26GX3/8 SAFETY (SYRINGE) ×6 IMPLANT
SYR TB 1ML LUER SLIP (SYRINGE) ×6 IMPLANT
TAPE CLOTH SURG 4X10 WHT LF (GAUZE/BANDAGES/DRESSINGS) ×2 IMPLANT
TOWEL OR 17X24 6PK STRL BLUE (TOWEL DISPOSABLE) ×3 IMPLANT
TOWEL OR 17X26 10 PK STRL BLUE (TOWEL DISPOSABLE) ×3 IMPLANT
WATER STERILE IRR 1000ML POUR (IV SOLUTION) ×3 IMPLANT
YANKAUER SUCT BULB TIP NO VENT (SUCTIONS) ×3 IMPLANT

## 2017-04-13 NOTE — Op Note (Signed)
NAME:   Randy Arnold         MEDICAL RECORD NO.:   [id number]  PHYSICIAN:  Estill Bamberg, MD      DATE OF BIRTH:   08-20-1965  DATE OF PROCEDURE:   04/13/2017                              OPERATIVE REPORT   PREOPERATIVE DIAGNOSES: 1.  Left-sided L5 radiculopathy. 2.  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  POSTOPERATIVE DIAGNOSES: 1.  Left-sided L5 radiculopathy. 2.  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  PROCEDURES:   Left-sided L4-5 laminotomy with partial facetectomy and removal of large herniated left-sided L4-5 disk fragment.  SURGEON:  Estill Bamberg, MD.  ASSISTANTJason Coop, PA-C.  ANESTHESIA:  General endotracheal anesthesia.  COMPLICATIONS:  None.  DISPOSITION:  Stable.  ESTIMATED BLOOD LOSS:  Minimal.  INDICATIONS FOR SURGERY:  Briefly, [name] is a pleasant 52 year old male, who did present to me with severe pain in the left leg.  The patient's MRI did reveal the findings outlined above, clearly notable for a large herniated disk fragment. The pain was rather severe.  We did discuss treatment options and we did ultimately elect to proceed with the procedure reflected above.  The patient was fully made aware of the risks of surgery, including the risk of recurrent herniation and the need for subsequent surgery, including the possibility of a subsequent diskectomy and/or fusion.  OPERATIVE DETAILS:  On July 06, 1965, the patient was brought to surgery and general endotracheal anesthesia was administered.  The patient was placed prone on a well-padded flat Jackson bed with a Wilson frame.  Antibiotics were given.  The back was prepped and draped and a time-out procedure was performed.  At this point, a midline incision was made directly over the L4-5 intervertebral space.  A curvilinear incision was made just to the left of the midline  into the fascia.  A self-retaining McCulloch retractor was placed.  The lamina of L4 and L5 was identified and subperiosteally exposed.  I then removed the lateral aspect of the L4-5 ligamentum flavum.  Readily identified was the traversing left L5 nerve, which was noted to be under obvious tension, and was noted to be rather erythematous.  I was able to gently gain medial retraction of the nerve, and in doing so, a very large herniated disk fragment was readily noted.  This was removed in one very large fragment.  This entirely alleviated the tension on the nerve.  I then additionally explored the lateral recess, and no additional herniated fragments were identified. I was very pleased with the final decompression that I was able to accomplish.  At this point, the wound was copiously irrigated with normal saline.  All epidural bleeding was controlled using bipolar electrocautery in addition to Surgiflo. All bleeding was controlled at the termination of the procedure.  At this point, 20 mg of Depo-Medrol was introduced about the epidural space in the region of the left L5 nerve.  The wound was then closed in layers using #1 Vicryl followed by 0 Vicryl, followed by 4-0 Monocryl. Benzoin and Steri-Strips were applied followed by a sterile dressing. All instrument counts were correct at the termination of the procedure.  Of note, Jason Coop was my assistant throughout surgery, and did aid in  retraction, suctioning, and closure from start to finish.     Estill BambergMark Judythe Postema, MD

## 2017-04-13 NOTE — Anesthesia Procedure Notes (Signed)
Procedure Name: Intubation Date/Time: 04/13/2017 10:01 AM Performed by: Lowella Dell, CRNA Pre-anesthesia Checklist: Patient identified, Emergency Drugs available, Suction available and Patient being monitored Patient Re-evaluated:Patient Re-evaluated prior to induction Oxygen Delivery Method: Circle System Utilized Preoxygenation: Pre-oxygenation with 100% oxygen Induction Type: IV induction Ventilation: Mask ventilation without difficulty and Oral airway inserted - appropriate to patient size Laryngoscope Size: Mac and 4 Grade View: Grade I Tube type: Oral Number of attempts: 1 Airway Equipment and Method: Stylet Placement Confirmation: ETT inserted through vocal cords under direct vision,  positive ETCO2 and breath sounds checked- equal and bilateral Secured at: 22 cm Tube secured with: Tape Dental Injury: Teeth and Oropharynx as per pre-operative assessment

## 2017-04-13 NOTE — Transfer of Care (Signed)
Immediate Anesthesia Transfer of Care Note  Patient: Randy Arnold  Procedure(s) Performed: LEFT SIDED LUMBAR FOUR-FIVE MICRODISECTOMY  REQUESTED (Left )  Patient Location: PACU  Anesthesia Type:General  Level of Consciousness: awake, alert , oriented and patient cooperative  Airway & Oxygen Therapy: Patient Spontanous Breathing and Patient connected to nasal cannula oxygen  Post-op Assessment: Report given to RN, Post -op Vital signs reviewed and stable and Patient moving all extremities X 4  Post vital signs: Reviewed and stable  Last Vitals:  Vitals Value Taken Time  BP 141/84 04/13/2017 12:45 PM  Temp 36.4 C 04/13/2017 12:45 PM  Pulse 92 04/13/2017 12:45 PM  Resp 12 04/13/2017 12:45 PM  SpO2 97 % 04/13/2017 12:45 PM  Vitals shown include unvalidated device data.  Last Pain:  Vitals:   04/13/17 0726  TempSrc:   PainSc: 10-Worst pain ever      Patients Stated Pain Goal: 3 (04/13/17 0726)  Complications: No apparent anesthesia complications

## 2017-04-13 NOTE — Anesthesia Postprocedure Evaluation (Signed)
Anesthesia Post Note  Patient: Randy Arnold  Procedure(s) Performed: LEFT SIDED LUMBAR FOUR-FIVE MICRODISECTOMY  REQUESTED (Left )     Patient location during evaluation: PACU Anesthesia Type: General Level of consciousness: awake and alert Pain management: pain level controlled Vital Signs Assessment: post-procedure vital signs reviewed and stable Respiratory status: spontaneous breathing, nonlabored ventilation, respiratory function stable and patient connected to nasal cannula oxygen Cardiovascular status: blood pressure returned to baseline and stable Postop Assessment: no apparent nausea or vomiting Anesthetic complications: no    Last Vitals:  Vitals:   04/13/17 1330 04/13/17 1407  BP: (!) 152/95 (!) 145/63  Pulse: 85 88  Resp: 13 16  Temp: 36.7 C 36.9 C  SpO2: 96% 100%    Last Pain:  Vitals:   04/13/17 1338  TempSrc:   PainSc: Asleep                 Shelton SilvasKevin D Balian Schaller

## 2017-04-13 NOTE — H&P (Signed)
PREOPERATIVE H&P  Chief Complaint: Left leg pain  HPI: Randy Arnold is a 52 y.o. 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 Arthritis   . Barrett's esophagus    Dr. Ewing SchleinMagod  . History of hiatal hernia   . Hyperlipidemia    Past Surgical History:  Procedure Laterality Date  . calcaneus surgery     spur resected  . COLONOSCOPY    . INGUINAL HERNIA REPAIR     bilat  . KNEE SURGERY     spur resected([facility])  . TONSILLECTOMY AND ADENOIDECTOMY    . UPPER GI ENDOSCOPY  08/27/2012   Dr Ewing SchleinMagod   Social History   Socioeconomic History  . Marital status: Married    Spouse name: Not on file  . Number of children: Not on file  . Years of education: Not on file  . Highest education level: Not on file  Occupational History  . Not on file  Social Needs  . Financial resource strain: Not on file  . Food insecurity:    Worry: Not on file    Inability: Not on file  . Transportation needs:    Medical: Not on file    Non-medical: Not on file  Tobacco Use  . Smoking status: Never Smoker  . Smokeless tobacco: Never Used  Substance and Sexual Activity  . Alcohol use: Yes    Comment: 2-3 beers 4x a week  . Drug use: No  . Sexual activity: Not on file  Lifestyle  . Physical activity:    Days per week: Not on file    Minutes per session: Not on file  . Stress: Not on file  Relationships  . Social connections:    Talks on phone: Not on file    Gets together: Not on file    Attends religious service: Not on file    Active member of club or organization: Not on file    Attends meetings of clubs or organizations: Not on file    Relationship status: Not on file  Other Topics Concern   . Not on file  Social History Narrative  . Not on file   Family History  Problem Relation Age of Onset  . Diabetes Mother   . Heart attack Paternal Grandfather [id number]  . Alzheimer's disease Father   . Cancer Neg Hx    No Known Allergies Prior to Admission medications   Medication Sig Start Date End Date Taking? Authorizing Provider  acetaminophen (TYLENOL) 650 MG CR tablet Take 1,950 mg by mouth 2 (two) times daily.   Yes [provider]  cyclobenzaprine (FLEXERIL) 10 MG tablet Take 10 mg by mouth 2 (two) times daily. 03/14/17  Yes [provider]  LYRICA 50 MG capsule Take 50 mg by mouth 2 (two) times daily. 03/20/17  Yes [provider]  atorvastatin (LIPITOR) 20 MG tablet Take 1 tablet (20 mg total) by mouth daily. DO NOT TAKE IF ON ANTIFUNGAL Patient not taking: Reported on 04/05/2017 03/12/14   Pecola LawlessHopper, William F, MD  HYDROcodone-acetaminophen [facility](NORCO) 10-325 MG tablet Take 1 tablet by mouth every 6 (six) hours as needed.    [provider]  All other systems have been reviewed and were otherwise negative with the exception of those mentioned in the HPI and as above.  Physical Exam: There were no vitals filed for this visit.  There is no height or weight on file to calculate BMI.  General: Alert, no acute distress Cardiovascular: No pedal edema Respiratory: No cyanosis, no use of accessory musculature Skin: No lesions in the area of chief complaint Neurologic: Sensation intact distally Psychiatric: Patient is competent for consent with normal mood and affect Lymphatic: No axillary or cervical lymphadenopathy  MUSCULOSKELETAL:  SLR on the left  Assessment/Plan: LEFT LEG PAIN Plan for Procedure(s): LEFT SIDED LUMBAR 4-5 MICRODISECTOMY   Emilee Hero, MD 04/13/2017 6:30 AM

## 2017-04-14 ENCOUNTER — Encounter (HOSPITAL_COMMUNITY): Payer: Self-pay | Admitting: Orthopedic Surgery

## 2017-04-14 MED FILL — Thrombin For Soln 20000 Unit: CUTANEOUS | Qty: 1 | Status: AC

## 2018-12-01 IMAGING — CR DG CHEST 2V
2 series · 2 of 2 positions shown · non-contrast
Comparison: None.

CLINICAL DATA: 52-year-old male with a history of preoperative
lumbar spine surgery

EXAM:
CHEST - 2 VIEW

[w chest pa]
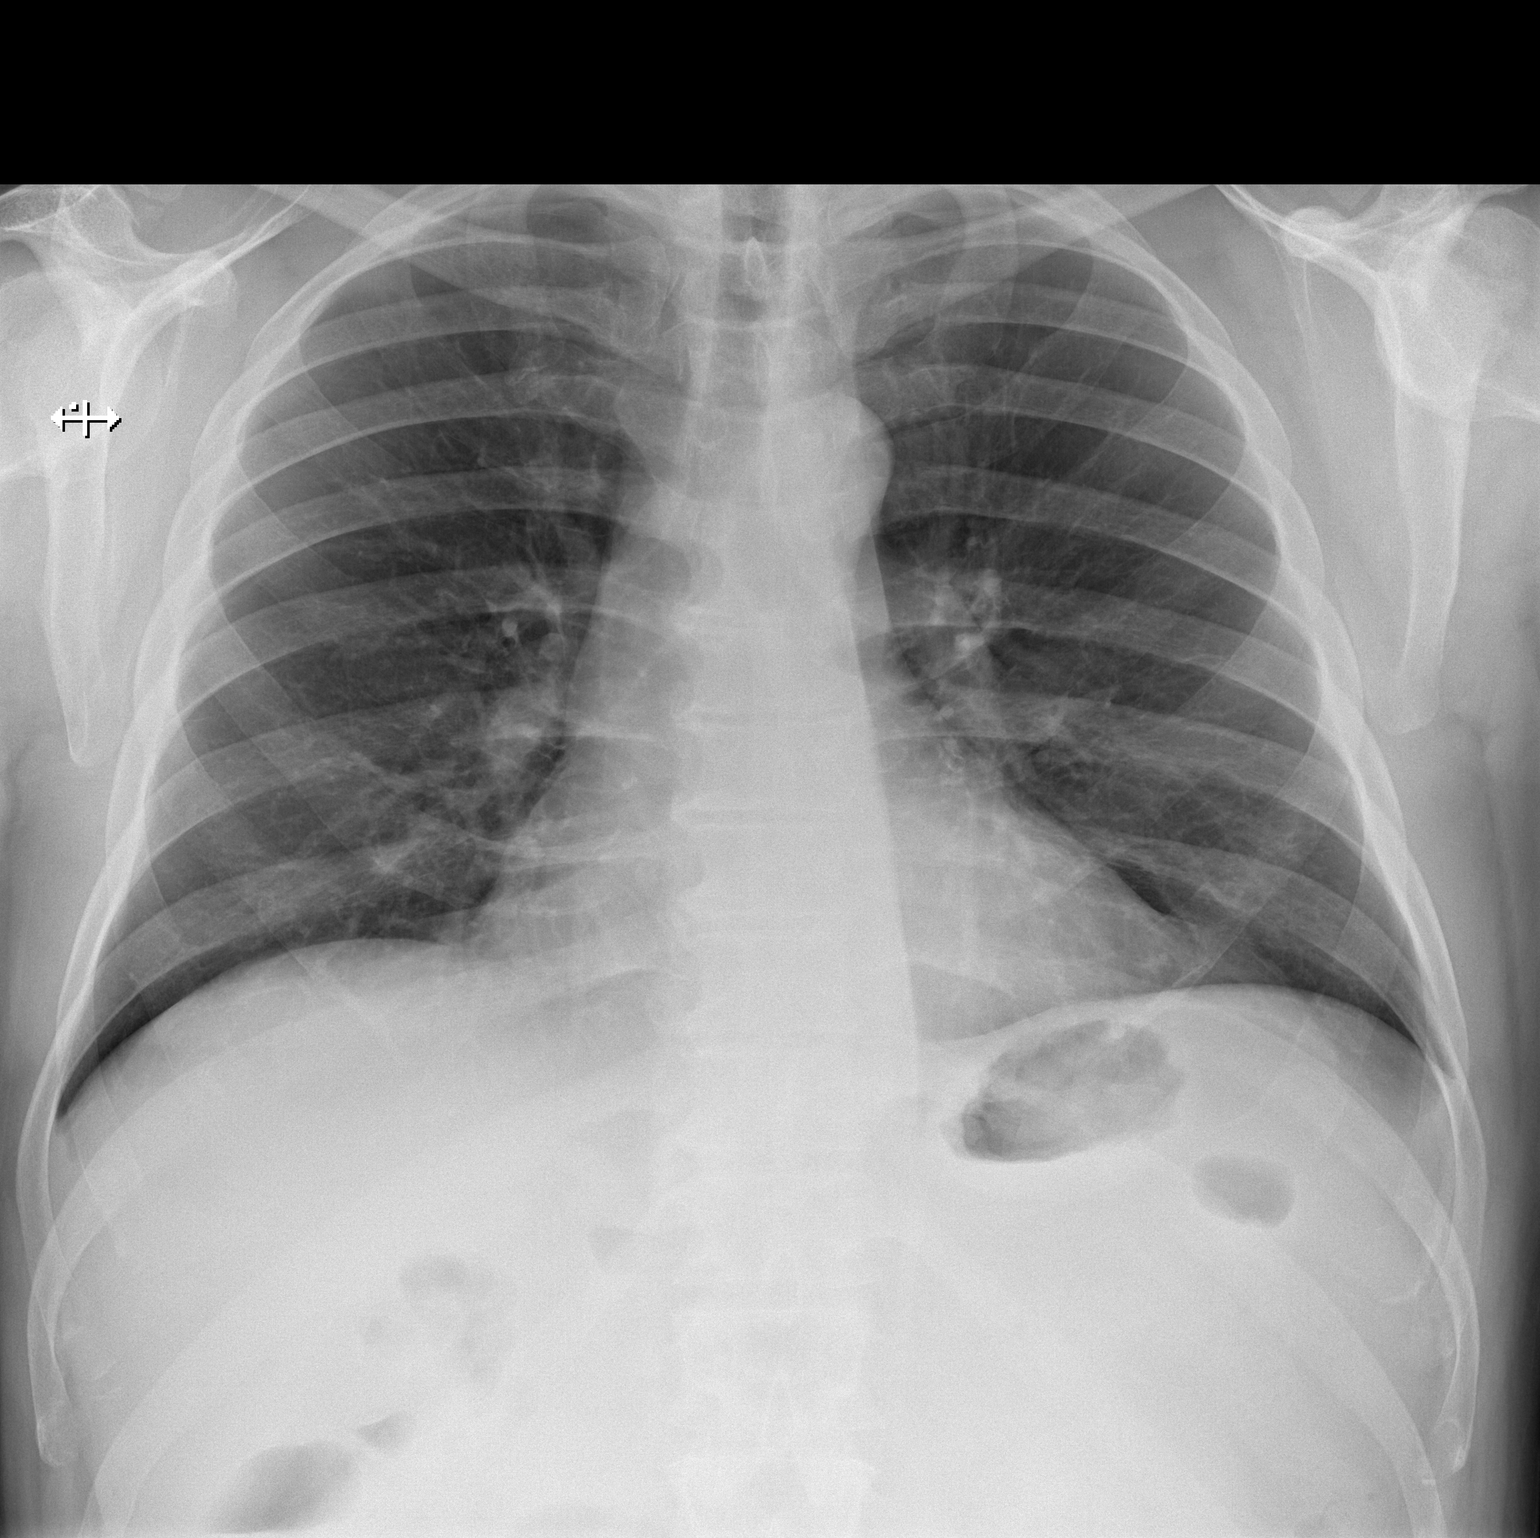

[w chest lat]
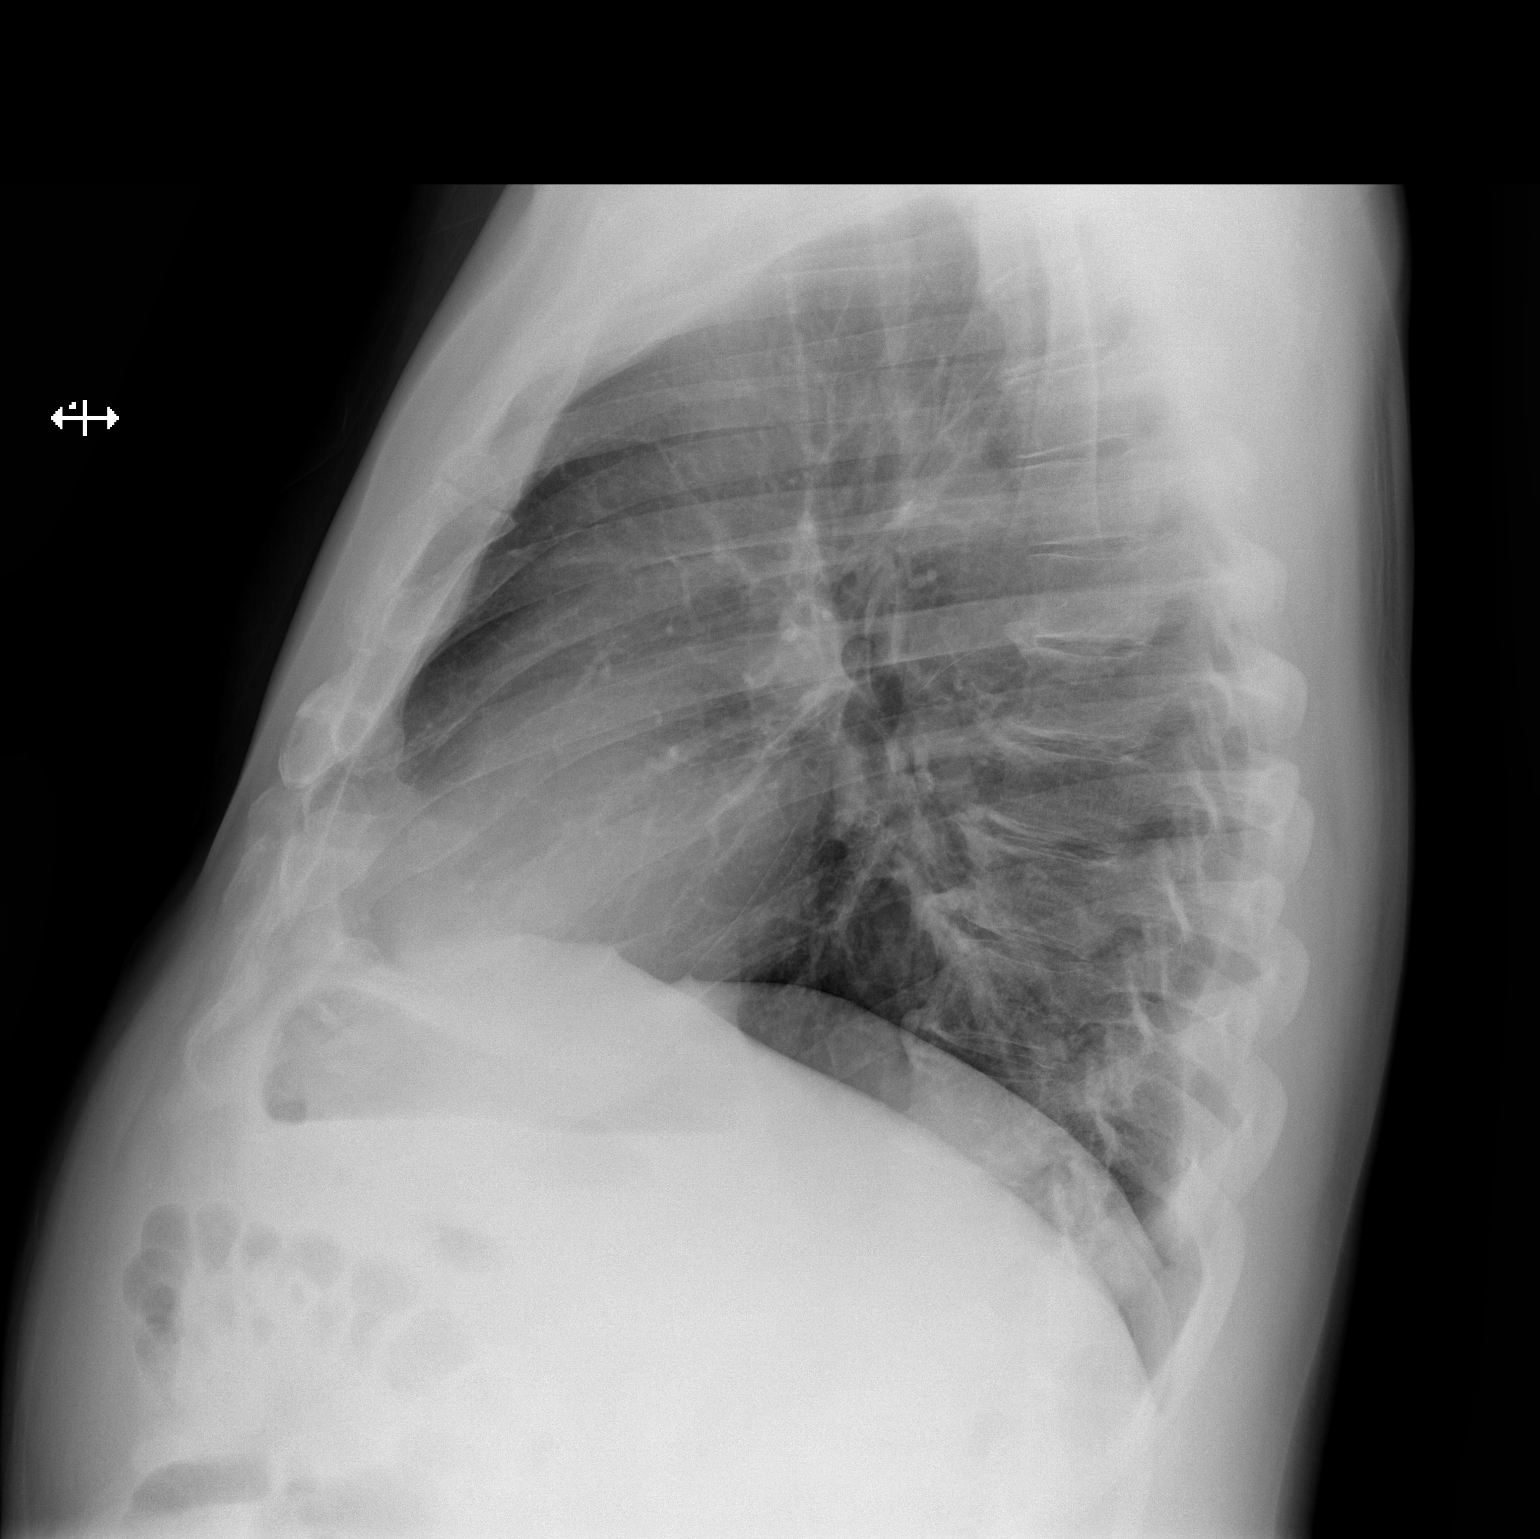

[2 of 2 positions shown; findings below may reference images not displayed]

FINDINGS: The heart size and mediastinal contours are within normal limits.
Both lungs are clear. The visualized skeletal structures are
unremarkable.
IMPRESSION: Negative for acute cardiopulmonary disease

## 2018-12-04 IMAGING — CR DG LUMBAR SPINE 1V
2 series · 2 of 2 positions shown · non-contrast
Comparison: Lumbar MRI, 03/24/2017

CLINICAL DATA: LEFT SIDED LUMBAR 4-5 MICRODISECTOMY operative
imaging.

EXAM:
LUMBAR SPINE - 1 VIEW

[lateral (1 of 2)]
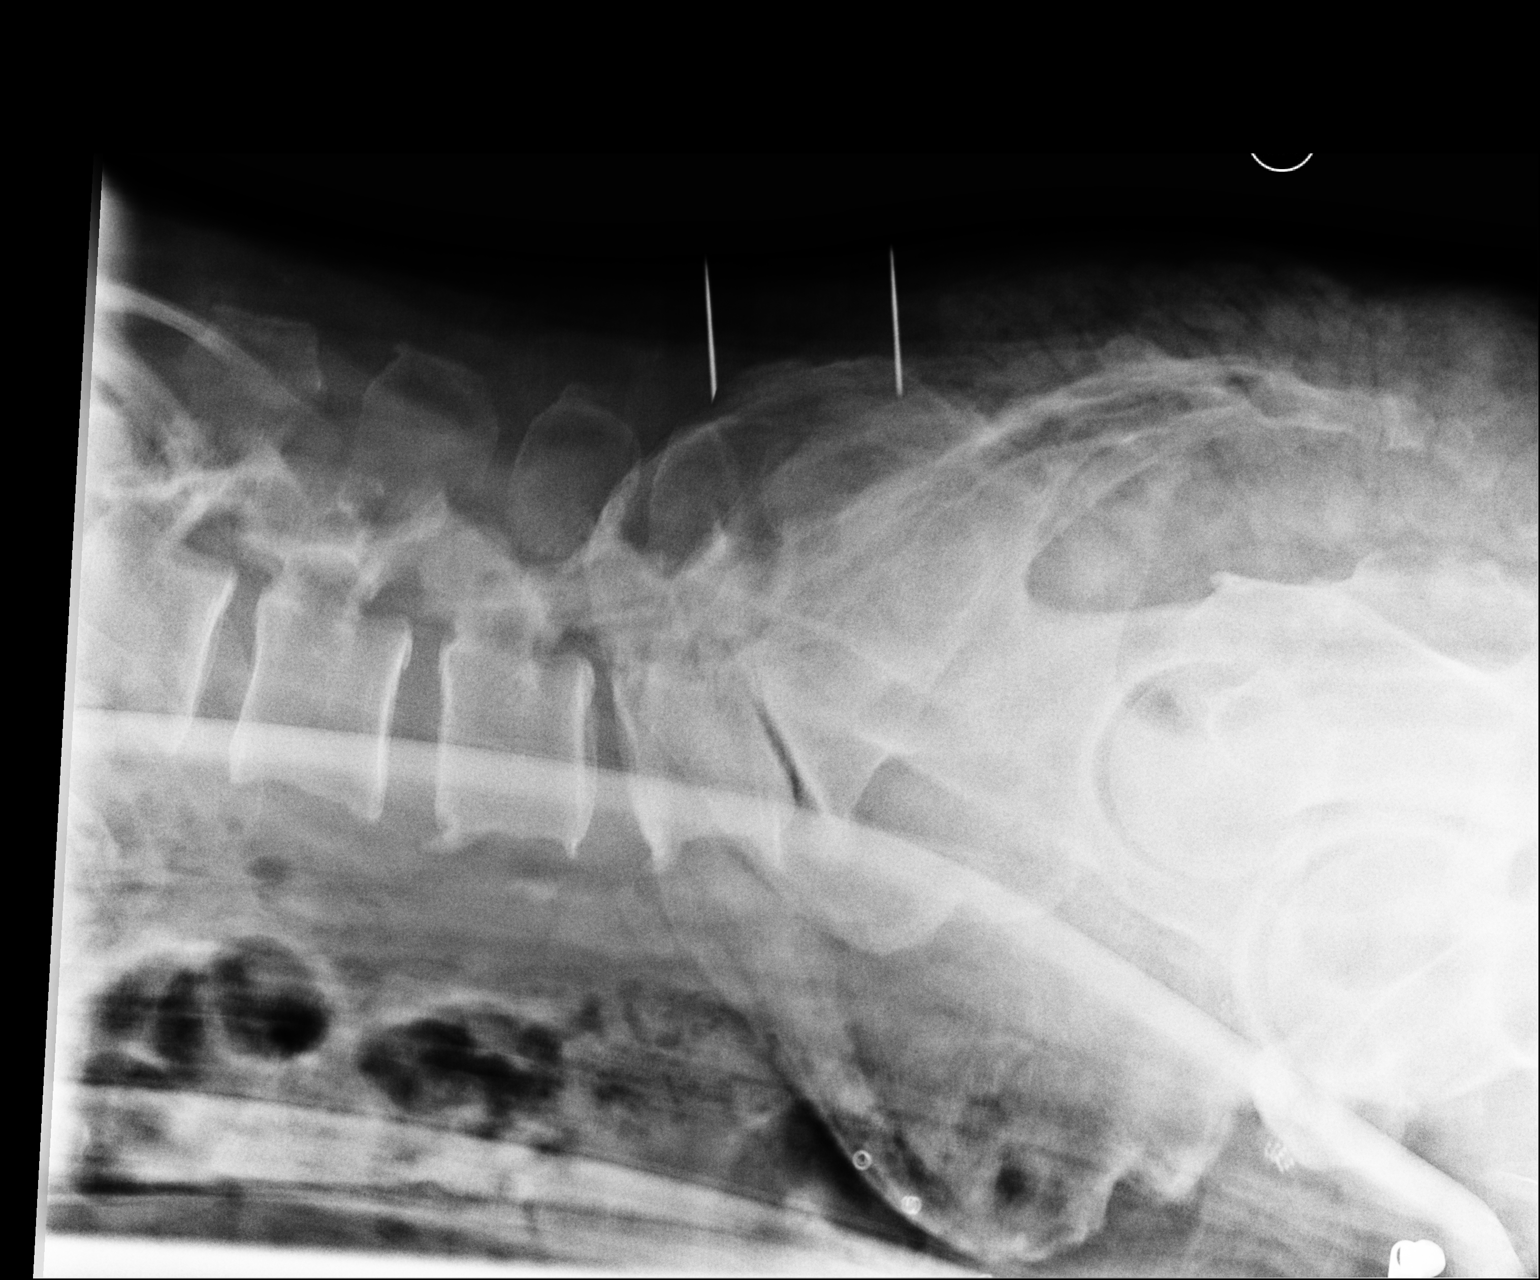

[lateral (2 of 2)]
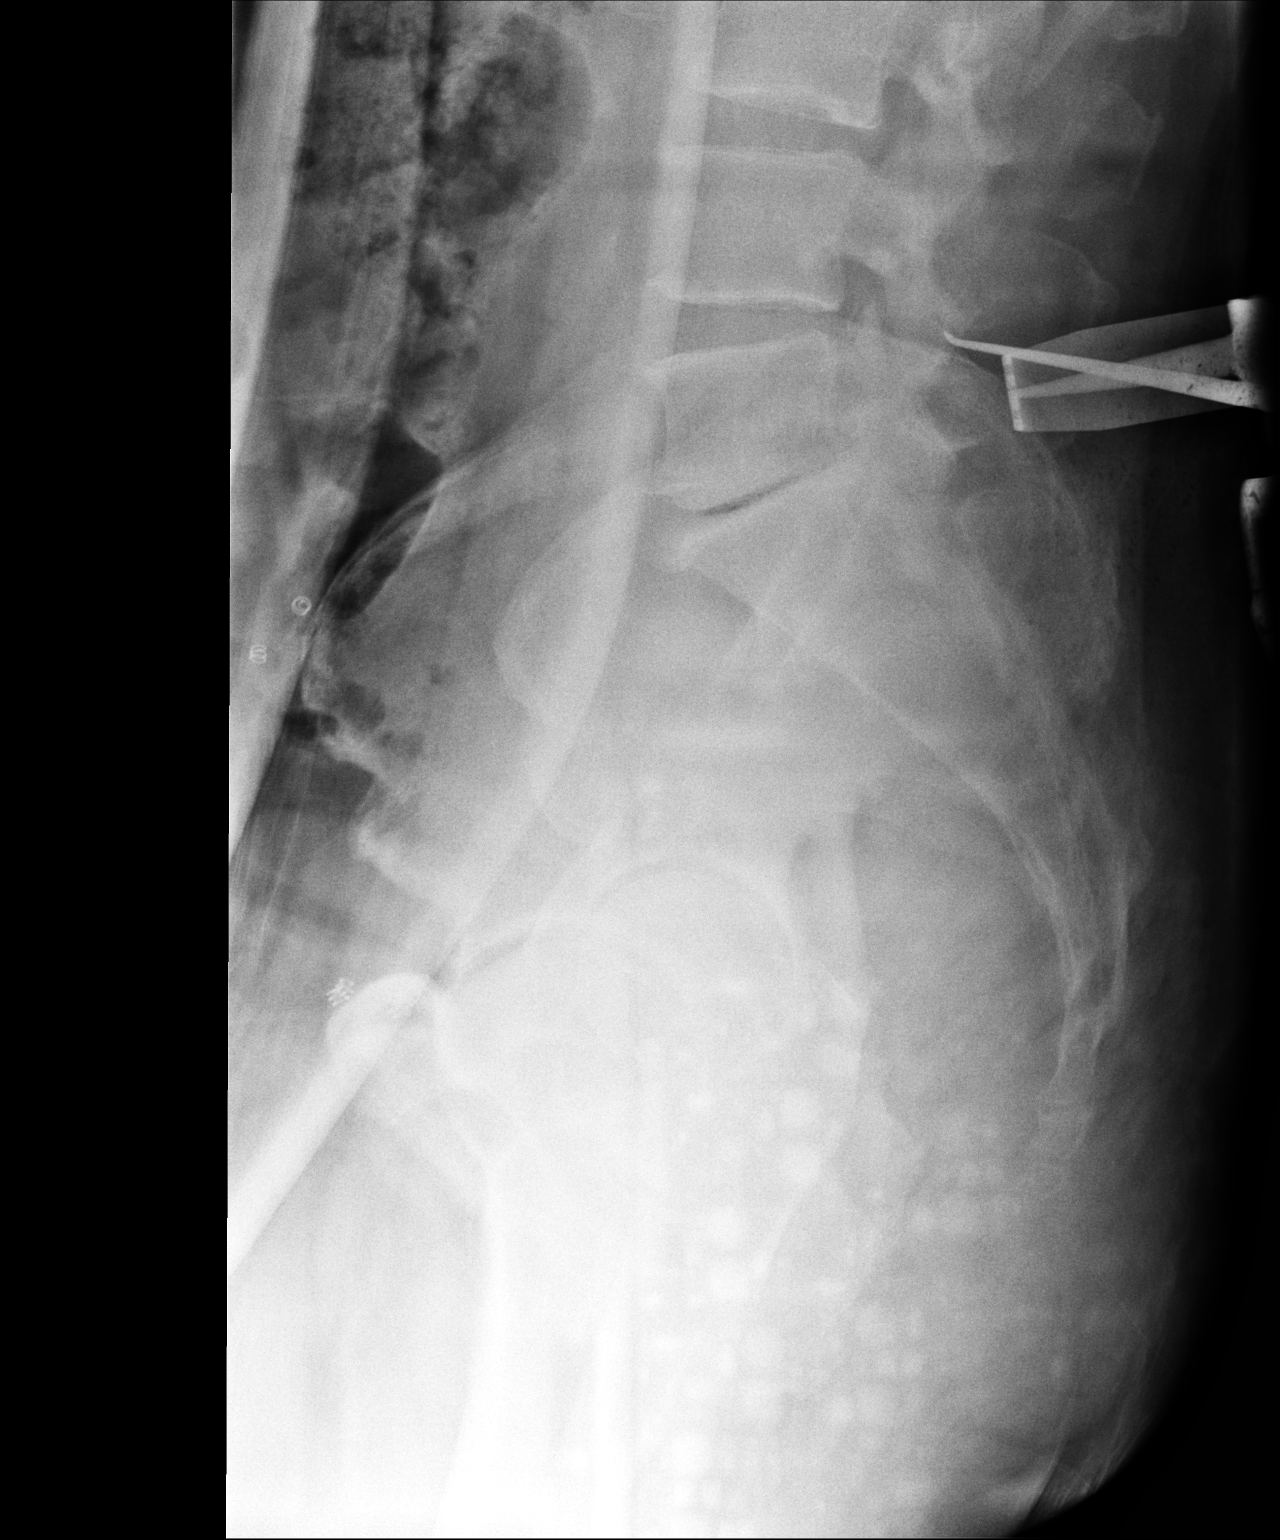

[2 of 2 positions shown; findings below may reference images not displayed]

FINDINGS: Initial view shows 2 surgical needles, the more superior with its
tip just posterior to the posterior margin of the L5 spinous
process, the more inferior, lying posterior to the S2 sacral
segment.

Subsequent image shows placement of a surgical probe between skin
retractors. Tip projects 3 cm posterior to the posterior margin of
the L4-L5 disc.
IMPRESSION: Portable lumbar spine imaging for localization.

## 2023-02-10 ENCOUNTER — Telehealth: Payer: Self-pay | Admitting: *Deleted

## 2023-02-10 NOTE — Telephone Encounter (Signed)
 Attempted to contact pt to reschedule his appt at Dr Maida Sciara request.  No answer.  Left message to call back to reschedule - ok to double book next week per JG.

## 2023-02-11 ENCOUNTER — Encounter: Payer: Self-pay | Admitting: Cardiology

## 2023-02-13 NOTE — Telephone Encounter (Signed)
 Pt has been scheduled per DrGanji's instructions to Friday 02/17/23.

## 2023-02-16 ENCOUNTER — Telehealth: Payer: Self-pay | Admitting: Cardiology

## 2023-02-16 NOTE — Telephone Encounter (Signed)
   Pre-operative Risk Assessment    Patient Name: Randy Arnold  DOB: [birth year] MRN: [id number]   Date of last office visit: N/A Date of next office visit: 02/17/23   Request for Surgical Clearance    Procedure:   Right Total Knee Replacement  Date of Surgery:  Clearance TBD                                Surgeon:  Dr. Jodi Geralds Surgeon's Group or Practice Name:  [facility] Phone number:  [phone] Fax number:  [phone]   Type of Clearance Requested:   - Pharmacy:  Hold To be determined by Cardiology      Type of Anesthesia:  Spinal   Additional requests/questions:  Please advise surgeon/provider what medications should be held.  Signed, Belisicia T Woods   02/16/2023, 10:23 AM

## 2023-02-16 NOTE — Telephone Encounter (Signed)
   Name: Randy Arnold  DOB: 01/09/1965  MRN: [id number]  Primary Cardiologist: None  Chart reviewed as part of pre-operative protocol coverage. The patient has an upcoming visit scheduled with Dr. Jacinto Halim on 02/17/2023 at which time clearance can be addressed in case there are any issues that would impact surgical recommendations.  Surgery is TBD as below. I added preop FYI to appointment note so that provider is aware to address at time of outpatient visit.  Per office protocol the cardiology provider should forward their finalized clearance decision and recommendations regarding antiplatelet therapy to the requesting party below.    Please call with any questions.  Denyce Robert, NP  02/16/2023, 1:19 PM

## 2023-02-17 ENCOUNTER — Ambulatory Visit: Payer: BC Managed Care – PPO | Attending: Cardiology | Admitting: Cardiology

## 2023-02-17 ENCOUNTER — Encounter: Payer: Self-pay | Admitting: Cardiology

## 2023-02-17 VITALS — BP 128/82 | HR 73 | Resp 16 | Ht 70.0 in | Wt 236.8 lb

## 2023-02-17 DIAGNOSIS — R739 Hyperglycemia, unspecified: Secondary | ICD-10-CM | POA: Diagnosis not present

## 2023-02-17 DIAGNOSIS — Z01818 Encounter for other preprocedural examination: Secondary | ICD-10-CM

## 2023-02-17 DIAGNOSIS — R0609 Other forms of dyspnea: Secondary | ICD-10-CM | POA: Diagnosis not present

## 2023-02-17 DIAGNOSIS — E782 Mixed hyperlipidemia: Secondary | ICD-10-CM

## 2023-02-17 MED ORDER — ATORVASTATIN CALCIUM 40 MG PO TABS: 40.0000 mg | ORAL_TABLET | Freq: Every day | ORAL | 1 refills | Status: DC

## 2023-02-17 MED ORDER — METOPROLOL TARTRATE 100 MG PO TABS: 100.0000 mg | ORAL_TABLET | Freq: Once | ORAL | 0 refills | Status: DC

## 2023-02-17 NOTE — Progress Notes (Signed)
 Cardiology Office Note:  .   Date:  02/19/2023  ID:  Randy Arnold, DOB November 10, 1965, MRN [id number] PCP: No primary care provider on file.  [facility] Providers Cardiologist:  Yates Decamp, MD   History of Present Illness: .   Randy Arnold is a 58 y.o.   Discussed the use of AI scribe software for clinical note transcription with the patient, who gave verbal consent to proceed.  History of Present Illness   Randy Arnold, a patient with a history of high cholesterol and recent arthroscopic surgery on his right knee, presents with concerns about shortness of breath and fatigue that have been ongoing for the past three months. The patient reports that these symptoms have coincided with a decrease in physical activity due to his knee surgery. Randy Arnold used to play pickleball four times a week for two hours at a time, but has had to stop due to his knee. Randy Arnold has been using a Peloton bike and walking for exercise, but at a much lower intensity than his previous activity level.  Randy Arnold also mentions a recent abnormal EKG, which has caused him some anxiety. Randy Arnold is scheduled for another surgery but is concerned about his heart health. Randy Arnold reports that his cholesterol level is high and Randy Arnold has been off his statin medication for about a year. Randy Arnold is also prediabetic with an A1c of 6.3%.  The patient has been experiencing some shortness of breath, but it is not constant and does not prevent him from carrying out daily activities. Randy Arnold does not smoke and has no family history of heart disease. Randy Arnold is also obese with a BMI of 33.     Labs   Lab Results  Component Value Date   CHOL 267 (H) 10/15/2013   HDL 48 10/15/2013   LDLCALC 171 (H) 10/15/2013   TRIG 240 (H) 10/15/2013   Lab Results  Component Value Date   NA 137 04/10/2017   K 3.9 04/10/2017   CO2 22 04/10/2017   GLUCOSE 107 (H) 04/10/2017   BUN 13 04/10/2017   CREATININE 0.98 04/10/2017   CALCIUM 9.4 04/10/2017   GFR 78.37 10/15/2013   GFRNONAA  >60 04/10/2017      Latest Ref Rng & Units 04/10/2017    8:42 AM 10/15/2013    8:53 AM 06/13/2010    4:29 PM  BMP  Glucose 65 - 99 mg/dL 782  956  99   BUN 6 - 20 mg/dL 13  16  22    Creatinine 0.61 - 1.24 mg/dL 2.13  1.1  0.86   Sodium 135 - 145 mmol/L 137  139  138   Potassium 3.5 - 5.1 mmol/L 3.9  4.4  3.9   Chloride 101 - 111 mmol/L 101  102  103   CO2 22 - 32 mmol/L 22  29  24    Calcium 8.9 - 10.3 mg/dL 9.4  9.4  8.7       Latest Ref Rng & Units 04/10/2017    8:42 AM 10/15/2013    8:53 AM 06/13/2010    4:29 PM  CBC  WBC 4.0 - 10.5 K/uL 7.4  6.7  5.9   Hemoglobin 13.0 - 17.0 g/dL 57.8  46.9  62.9   Hematocrit 39.0 - 52.0 % 44.4  46.9  39.6   Platelets 150 - 400 K/uL 297  285.0  230    Lab Results  Component Value Date   HGBA1C 5.8 10/18/2013    Lab Results  Component Value  Date   TSH 2.14 10/15/2013    External Labs:  Care everywhere  Labs 02/10/2023:  Total cholesterol 257, triglycerides 176, HDL 45, LDL 179.  Non-HDL cholesterol 212.  A1c 6.3%.  Serum glucose 1 1 5  mg, BUN 13, creatinine 1.04, EGFR 82 mL, potassium 4.5, LFTs normal.  Labs 07/01/2019:  TSH normal at 2.110.  Review of Systems  Cardiovascular:  Positive for dyspnea on exertion. Negative for chest pain and leg swelling.   Physical Exam:   VS:  BP 128/82 (BP Location: Left Arm, Patient Position: Sitting, Cuff Size: Large)   Pulse 73   Resp 16   Ht 5\' 10"  (1.778 m)   Wt 236 lb 12.8 oz (107.4 kg)   SpO2 98%   BMI 33.98 kg/m    Wt Readings from Last 3 Encounters:  02/17/23 236 lb 12.8 oz (107.4 kg)  04/13/17 234 lb (106.1 kg)  04/10/17 234 lb 8 oz (106.4 kg)     Physical Exam Constitutional:      Appearance: Randy Arnold is obese.  Neck:     Vascular: No carotid bruit or JVD.  Cardiovascular:     Rate and Rhythm: Normal rate and regular rhythm.     Pulses: Intact distal pulses.     Heart sounds: Normal heart sounds. No murmur heard.    No gallop.  Pulmonary:     Effort: Pulmonary effort is  normal.     Breath sounds: Normal breath sounds.  Abdominal:     General: Bowel sounds are normal.     Palpations: Abdomen is soft.  Musculoskeletal:     Right lower leg: No edema.     Left lower leg: No edema.    Studies Reviewed: Marland Kitchen     EKG:    EKG Interpretation Date/Time:  Friday February 17 2023 11:54:09 EST Ventricular Rate:  68 PR Interval:  206 QRS Duration:  82 QT Interval:  366 QTC Calculation: 389 R Axis:   30  Text Interpretation: EKG 02/17/2023: Normal sinus rhythm at rate of 68 bpm, normal EKG.  Compared to 04/10/2017, no change. Confirmed by Delrae Rend [phone]) on 02/17/2023 12:11:58 PM    Medications and allergies    No Known Allergies   Current Outpatient Medications:    atorvastatin (LIPITOR) 40 MG tablet, Take 1 tablet (40 mg total) by mouth daily., Disp: 90 tablet, Rfl: 1   ibuprofen (ADVIL) 800 MG tablet, Take 800 mg by mouth as needed for mild pain (pain score 1-3)., Disp: , Rfl:    metoprolol tartrate (LOPRESSOR) 100 MG tablet, Take 1 tablet (100 mg total) by mouth once for 1 dose. Take 90-120 minutes prior to scan. Hold for SBP less than 110., Disp: 1 tablet, Rfl: 0   ZEPBOUND 2.5 MG/0.5ML Pen, Inject 2.5 mg into the skin once a week., Disp: , Rfl:    ASSESSMENT AND PLAN: .      ICD-10-CM   1. Pre-op evaluation  Z01.818 CT CORONARY MORPH W/CTA COR W/SCORE W/CA W/CM &/OR WO/CM    Basic metabolic panel    2. Mixed hyperlipidemia  E78.2 EKG 12-Lead    Basic metabolic panel    3. Dyspnea on exertion  R06.09 CT CORONARY MORPH W/CTA COR W/SCORE W/CA W/CM &/OR WO/CM    Basic metabolic panel    4. Hyperglycemia  R73.9 Basic metabolic panel    Assessment and Plan    Dyspnea on Exertion Intermittent dyspnea over the past three months may be related to decreased physical activity following  knee surgery. Differential diagnosis includes deconditioning, cardiovascular issues, or weight gain. EKG shows normal sinus rhythm with a rate of 68 bpm, unchanged  from previous. Order a coronary CT angiogram to assess plaque volume and stenosis, and an echocardiogram. Continue current physical activity regimen: Peloton 30 minutes, three times a week, and walking two to three times a week.  Mixed Hyperlipidemia Total cholesterol is 257 mg/dL, LDL 295 mg/dL, and HDL 45 mg/dL. Previous atorvastatin therapy was discontinued. Restart atorvastatin 40 mg once daily to manage lipid levels and reduce cardiovascular risk. Monitor lipid levels in follow-up.  Obesity BMI is 33, contributing to cardiovascular risks and prediabetes. Currently on semaglutide (GLP-1 agonist) with noted appetite suppression. Discussed the GLP-1 mechanism and the importance of slow eating. Continue semaglutide injections and advise slow eating to enhance GLP-1 efficacy. Encourage weight loss to reduce waist size from 36 to 32 inches.  Prediabetes A1c is 6.3%, indicating prediabetes. Emphasized lifestyle modifications and weight management. Encourage dietary changes and weight loss. Monitor A1c in follow-up.  Post-Arthroscopic Knee Surgery Recent right knee arthroscopic surgery for meniscus repair. Limited physical activity post-surgery may contribute to deconditioning and dyspnea. Encourage a gradual increase in physical activity as tolerated.  General Health Maintenance Encourage regular physical activity and advise on dietary modifications to manage cholesterol and glucose levels.  Follow-up Schedule a follow-up appointment in three months. Coordinate with orthopedics regarding surgery timing. Monitor response to atorvastatin and semaglutide therapy.         Signed,  Yates Decamp, MD, [facility] 02/19/2023, 7:14 PM [facility] [address] [place], [place] [address] Phone: [phone]. Fax:  [phone]

## 2023-02-17 NOTE — Patient Instructions (Addendum)
Medication Instructions:  Your physician has recommended you make the following change in your medication:  Start Atorvastatin 40 mg by mouth daily   *If you need a refill on your cardiac medications before your next appointment, please call your pharmacy*   Lab Work: Have lab work (BMP) checked prior to CT Scan You may go to any of these [facility] locations:   [facility] - [address] ([facility]) - [address] [phone] [address]   [place] - [address]    [place]  - [address]    [place] - [address] - [address] [employer]  - [address] [place] [address] ([facility]   If you have labs (blood work) drawn today and your tests are completely normal, you will receive your results only by: [employer] (if you have MyChart) OR A paper copy in the mail If you have any lab test that is abnormal or we need to change your treatment, we will call you to review the results.   Testing/Procedures: Your physician has requested that you have cardiac CT. Cardiac computed tomography (CT) is a painless test that uses an x-ray machine to take clear, detailed pictures of your heart. For further information please visit [email or web address] Please follow instruction sheet as given.   Follow-Up: At [facility], you and your health needs are our priority.  As part of our continuing mission to provide you with exceptional heart care, we have created designated Provider Care Teams.  These Care Teams include your primary Cardiologist (physician) and Advanced Practice Providers (APPs -  Physician Assistants and Nurse Practitioners) who all work together to provide you with the care you need, when you need it.  We recommend signing up for the patient portal called "MyChart".  Sign up information is provided on this After Visit Summary.  MyChart is  used to connect with patients for Virtual Visits (Telemedicine).  Patients are able to view lab/test results, encounter notes, upcoming appointments, etc.  Non-urgent messages can be sent to your provider as well.   To learn more about what you can do with MyChart, go to [email or web address].    Your next appointment:   April 21 at 3:40  Provider:   Yates Decamp, MD     Other Instructions   Your cardiac CT will be scheduled at:   [facility] [address] [place], [place] [address] [phone]  If scheduled at [facility], please arrive at the [facility] Entrance (Entrance C2) of [facility] 30 minutes prior to test start time.  You can use the FREE valet parking offered at entrance C (encouraged to control the heart rate for the test).  Proceed to the [facility] Radiology Department (first floor) to check-in and test prep.  All radiology patients and guests should use entrance C2 at [facility], accessed from [facility], even though the hospital's physical address listed is [address].    Please follow these instructions carefully (unless otherwise directed):  An IV will be required for this test and Nitroglycerin will be given.  Hold all erectile dysfunction medications at least 3 days (72 hrs) prior to test. (Ie viagra, cialis, sildenafil, tadalafil, etc)   On the Night Before the Test: Be sure to Drink plenty of water. Do not consume  any caffeinated/decaffeinated beverages or chocolate 12 hours prior to your test. Do not take any antihistamines 12 hours prior to your test.  On the Day of the Test: Drink plenty of water until 1 hour prior to the test. Do not eat any food 1 hour prior to test. You may take your regular medications prior to the test.  Take metoprolol (Lopressor) 100 mg two hours prior to test. Patients who wear a continuous glucose monitor MUST remove the device prior to  scanning.  After the Test: Drink plenty of water. After receiving IV contrast, you may experience a mild flushed feeling. This is normal. On occasion, you may experience a mild rash up to 24 hours after the test. This is not dangerous. If this occurs, you can take Benadryl 25 mg, Zyrtec, Claritin, or Allegra and increase your fluid intake. (Patients taking Tikosyn should avoid Benadryl, and may take Zyrtec, Claritin, or Allegra) If you experience trouble breathing, this can be serious. If it is severe call 911 IMMEDIATELY. If it is mild, please call our office.  We will call to schedule your test 2-4 weeks out understanding that some insurance companies will need an authorization prior to the service being performed.   For more information and frequently asked questions, please visit our website : [email or web address]  For non-scheduling related questions, please contact the cardiac imaging nurse navigator should you have any questions/concerns: Cardiac Imaging Nurse Navigators Direct Office Dial: [phone]   For scheduling needs, including cancellations and rescheduling, please call [place], [phone].

## 2023-02-18 ENCOUNTER — Other Ambulatory Visit: Payer: Self-pay | Admitting: Cardiology

## 2023-02-21 ENCOUNTER — Encounter: Payer: Self-pay | Admitting: Cardiology

## 2023-03-08 ENCOUNTER — Telehealth (HOSPITAL_COMMUNITY): Payer: Self-pay | Admitting: *Deleted

## 2023-03-08 NOTE — Telephone Encounter (Signed)
 Attempted to call patient regarding upcoming cardiac CT appointment. Left message on voicemail with name and callback number  Larey Brick RN Navigator Cardiac Imaging [facility] [phone] Office [phone] Cell

## 2023-03-09 ENCOUNTER — Ambulatory Visit (HOSPITAL_COMMUNITY)
Admission: RE | Admit: 2023-03-09 | Discharge: 2023-03-09 | Disposition: A | Discharge status: Home / Self Care | Source: Ambulatory Visit | Attending: Cardiology | Admitting: Cardiology

## 2023-03-09 DIAGNOSIS — Z01818 Encounter for other preprocedural examination: Secondary | ICD-10-CM | POA: Diagnosis present

## 2023-03-09 DIAGNOSIS — R0609 Other forms of dyspnea: Secondary | ICD-10-CM

## 2023-03-09 DIAGNOSIS — I2510 Atherosclerotic heart disease of native coronary artery without angina pectoris: Secondary | ICD-10-CM | POA: Diagnosis not present

## 2023-03-09 MED ORDER — NITROGLYCERIN 0.4 MG SL SUBL
SUBLINGUAL_TABLET | SUBLINGUAL | Status: AC
  Filled 2023-03-09: qty 2

## 2023-03-09 MED ORDER — IOHEXOL 350 MG/ML SOLN
95.0000 mL | Freq: Once | INTRAVENOUS | Status: AC | PRN
Start: 2023-03-09 — End: 2023-03-09
  Administered 2023-03-09: 95 mL via INTRAVENOUS

## 2023-03-09 MED ORDER — NITROGLYCERIN 0.4 MG SL SUBL
0.8000 mg | SUBLINGUAL_TABLET | Freq: Once | SUBLINGUAL | Status: AC
  Administered 2023-03-09: 0.8 mg via SUBLINGUAL

## 2023-03-12 ENCOUNTER — Encounter: Payer: Self-pay | Admitting: Cardiology

## 2023-03-12 NOTE — Progress Notes (Signed)
 Minimal coronary arteries. You definitely need statins to get LDL down to < 70 in view of your age. Coronary calcium score in 83rd percentile.

## 2023-03-23 ENCOUNTER — Other Ambulatory Visit (HOSPITAL_COMMUNITY): Payer: Self-pay | Admitting: Orthopedic Surgery

## 2023-03-23 DIAGNOSIS — M25561 Pain in right knee: Secondary | ICD-10-CM

## 2023-03-24 ENCOUNTER — Ambulatory Visit (HOSPITAL_COMMUNITY)
Admission: RE | Admit: 2023-03-24 | Discharge: 2023-03-24 | Disposition: A | Discharge status: Home / Self Care | Source: Ambulatory Visit | Attending: Orthopedic Surgery | Admitting: Orthopedic Surgery

## 2023-03-24 DIAGNOSIS — M25561 Pain in right knee: Secondary | ICD-10-CM | POA: Insufficient documentation

## 2023-03-24 NOTE — Progress Notes (Signed)
 Right lower extremity venous duplex has been completed. Preliminary results can be found in CV Proc through chart review.  Results were given to Rosanne Ashing at Dr. Darene Lamer office.  03/24/23 4:14 PM Olen Cordial RVT

## 2023-04-18 ENCOUNTER — Other Ambulatory Visit (HOSPITAL_COMMUNITY)

## 2023-04-24 ENCOUNTER — Ambulatory Visit: Payer: Self-pay | Attending: Cardiology | Admitting: Cardiology

## 2023-04-24 ENCOUNTER — Encounter: Payer: Self-pay | Admitting: Cardiology

## 2023-04-24 ENCOUNTER — Ambulatory Visit: Payer: No Typology Code available for payment source | Admitting: Cardiology

## 2023-04-24 VITALS — BP 126/72 | HR 85 | Ht 70.0 in | Wt 228.2 lb

## 2023-04-24 DIAGNOSIS — R931 Abnormal findings on diagnostic imaging of heart and coronary circulation: Secondary | ICD-10-CM

## 2023-04-24 DIAGNOSIS — E782 Mixed hyperlipidemia: Secondary | ICD-10-CM

## 2023-04-24 NOTE — Patient Instructions (Signed)
 Medication Instructions:  Your physician recommends that you continue on your current medications as directed. Please refer to the Current Medication list given to you today.  *If you need a refill on your cardiac medications before your next appointment, please call your pharmacy*  Lab Work: Have fasting lipid profile checked.  Can be done at any [facility] location If you have labs (blood work) drawn today and your tests are completely normal, you will receive your results only by: MyChart Message (if you have MyChart) OR A paper copy in the mail If you have any lab test that is abnormal or we need to change your treatment, we will call you to review the results.  Testing/Procedures: none  Follow-Up: At [facility], you and your health needs are our priority.  As part of our continuing mission to provide you with exceptional heart care, our providers are all part of one team.  This team includes your primary Cardiologist (physician) and Advanced Practice Providers or APPs (Physician Assistants and Nurse Practitioners) who all work together to provide you with the care you need, when you need it.  Your next appointment:   As needed  Provider:   Knox Perl, MD     We recommend signing up for the patient portal called "MyChart".  Sign up information is provided on this After Visit Summary.  MyChart is used to connect with patients for Virtual Visits (Telemedicine).  Patients are able to view lab/test results, encounter notes, upcoming appointments, etc.  Non-urgent messages can be sent to your provider as well.   To learn more about what you can do with MyChart, go to [email or web address].   Other Instructions You may go to any of these [facility] locations:   [facility] - [address] ([facility]) - [address] [phone] [address]   [place] - [address]    [place]  - [address]    [place] - [address] - [address] [employer]  - [address] [place] - [address] ([facility])  [place]   - [address]    1st Floor: - Lobby - Registration  - Pharmacy  - Lab - Cafe  2nd Floor: - PV Lab - Diagnostic Testing (echo, CT, nuclear med)  3rd Floor: - Vacant  4th Floor: - TCTS (cardiothoracic surgery) - AFib Clinic - Structural Heart Clinic - Vascular Surgery  - Vascular Ultrasound  5th Floor: - [facility] (general and EP) - Clinical Pharmacy for coumadin, hypertension, lipid, weight-loss medications, and med management appointments    Valet parking services will be available as well.

## 2023-04-24 NOTE — Progress Notes (Signed)
 Cardiology Office Note:  .   Date:  04/25/2023  ID:  Randy Arnold, DOB 02-18-1965, MRN [id number] PCP: Maryln Sober, PA-C  [facility] Providers Cardiologist:  Knox Perl, MD   History of Present Illness: .    Discussed the use of AI scribe software for clinical note transcription with the patient, who gave verbal consent to proceed.  History of Present Illness Randy Arnold, a patient with a history of high LDL cholesterol levels, presents five and a half weeks post knee surgery. He reports good progress in recovery, with an outstanding range of motion, measuring 138 degrees in bending. However, he notes persistent swelling and a clicking sound in the knee. In the initial recovery period, he experienced complications with excessive blood and fluid accumulation in the knee, requiring the removal of 350 CCs of blood. He is now focusing on strengthening the knee. He also mentions a bone spur in the other knee, for which he plans to have surgery after a trip to [place] in June.  In terms of his high LDL cholesterol levels, he has been compliant with his atorvastatin  medication, taking 40 milligrams daily for the past two and a half months. He reports no issues with the medication and no cardiac symptoms such as shortness of breath or chest discomfort. He is also engaging in light exercise on a [employer].  Randy Arnold is a 58 y.o. Caucasian male patient with severe hypercholesterolemia with LDL >160, hyperglycemia, mild obesity, mild dyspnea on exertion underwent coronary CT angiogram and presents for follow-up.  He is tolerating Lipitor 40 mg daily.  Recently underwent right knee replacement without periprocedural complications.  Labs    Lab Results  Component Value Date   NA 137 04/10/2017   K 3.9 04/10/2017   CO2 22 04/10/2017   GLUCOSE 107 (H) 04/10/2017   BUN 13 04/10/2017   CREATININE 0.98 04/10/2017   CALCIUM  9.4 04/10/2017   GFR 78.37 10/15/2013   GFRNONAA >60 04/10/2017       Latest Ref Rng & Units 04/10/2017    8:42 AM 10/15/2013    8:53 AM 06/13/2010    4:29 PM  BMP  Glucose 65 - 99 mg/dL 956  387  99   BUN 6 - 20 mg/dL 13  16  22    Creatinine 0.61 - 1.24 mg/dL 5.64  1.1  3.32   Sodium 135 - 145 mmol/L 137  139  138   Potassium 3.5 - 5.1 mmol/L 3.9  4.4  3.9   Chloride 101 - 111 mmol/L 101  102  103   CO2 22 - 32 mmol/L 22  29  24    Calcium  8.9 - 10.3 mg/dL 9.4  9.4  8.7       Latest Ref Rng & Units 04/10/2017    8:42 AM 10/15/2013    8:53 AM 06/13/2010    4:29 PM  CBC  WBC 4.0 - 10.5 K/uL 7.4  6.7  5.9   Hemoglobin 13.0 - 17.0 g/dL 95.1  88.4  16.6   Hematocrit 39.0 - 52.0 % 44.4  46.9  39.6   Platelets 150 - 400 K/uL 297  285.0  230    Lab Results  Component Value Date   HGBA1C 5.8 10/18/2013    Lab Results  Component Value Date   TSH 2.14 10/15/2013    External Labs:  Care everywhere  Labs 02/10/2023:  Total cholesterol 257, triglycerides 176, HDL 45, LDL 179.  Non-HDL cholesterol 212.  A1c 6.3%.  Serum glucose 1 1 5  mg, BUN 13, creatinine 1.04, EGFR 82 mL, potassium 4.5, LFTs normal.  Labs 07/01/2019:  TSH normal at 2.110  Review of Systems  Cardiovascular:  Negative for chest pain, dyspnea on exertion and leg swelling.   Physical Exam:   VS:  BP 126/72   Pulse 85   Ht 5\' 10"  (1.778 m)   Wt 228 lb 3.2 oz (103.5 kg)   SpO2 97%   BMI 32.74 kg/m    Wt Readings from Last 3 Encounters:  04/24/23 228 lb 3.2 oz (103.5 kg)  02/17/23 236 lb 12.8 oz (107.4 kg)  04/13/17 234 lb (106.1 kg)    Physical Exam Neck:     Vascular: No carotid bruit or JVD.  Cardiovascular:     Rate and Rhythm: Normal rate and regular rhythm.     Pulses: Intact distal pulses.     Heart sounds: Normal heart sounds. No murmur heard.    No gallop.  Pulmonary:     Effort: Pulmonary effort is normal.     Breath sounds: Normal breath sounds.  Abdominal:     General: Bowel sounds are normal.     Palpations: Abdomen is soft.  Musculoskeletal:      Right lower leg: No edema.     Left lower leg: No edema.    Studies Reviewed: Aaron Aas    Coronary CTA 03/28/2023: 1. Coronary calcium  score of 266. This was 83rd percentile for age-, sex, and race-matched controls.   2. Total plaque volume 212 mm3 which is 45th percentile for age- and sex-matched controls (calcified plaque 48 mm3; non-calcified plaque 164 mm3). TPV is moderate.   3. Normal coronary origin with right dominance.   4. Mild plaque (25-49%) in the mid LAD.   5. D1 contains mild mixed density plaque (25-49%).   6. Mild mixed density plaque in the proximal and mid RCA (25-49%).   7. Minimal non-calcified plaque (<25%) in the LCX.  EKG:         Medications and allergies    No Known Allergies   Current Outpatient Medications:    atorvastatin  (LIPITOR) 40 MG tablet, Take 1 tablet (40 mg total) by mouth daily., Disp: 90 tablet, Rfl: 1   ZEPBOUND 2.5 MG/0.5ML Pen, Inject 2.5 mg into the skin once a week., Disp: , Rfl:    No orders of the defined types were placed in this encounter.    Medications Discontinued During This Encounter  Medication Reason   ibuprofen (ADVIL) 800 MG tablet No longer needed (for PRN medications)   metoprolol  tartrate (LOPRESSOR ) 100 MG tablet One time medication     ASSESSMENT AND PLAN: .      ICD-10-CM   1. Elevated coronary artery calcium  score 03/28/2023: Total score 266, 83rd percentile in [facility] database.  R93.1 Lipid Profile    2. Mixed hyperlipidemia  E78.2 Lipid Profile     Assessment & Plan Coronary artery disease with minor plaque   A CT angiogram identified minor plaque in the coronary arteries. He is asymptomatic, experiencing no chest discomfort or dyspnea. The presence of soft plaque around calcium  deposits increases the risk of myocardial infarction. Primary prevention and LDL management are crucial to prevent plaque progression. PCSK9 inhibitors may be considered if statin therapy is insufficient. Order a lipid panel to  assess current LDL levels. Manage lipid levels until controlled, then transition management to his PCP. Consider PCSK9 inhibitors if statin therapy is insufficient. Send a note to the PCP regarding the  lipid management plan.  Hyperlipidemia   He has hyperlipidemia with elevated LDL levels and has been on atorvastatin  40 mg for two and a half months with good compliance. There is potential for him to be a super responder to statin therapy. If LDL levels remain high, consider adding Zetia. Controlling LDL is essential to prevent cardiovascular events. Increasing the statin dose may only reduce LDL by 5-6%. Order a lipid panel to assess the response to atorvastatin . Continue atorvastatin  40 mg daily. Consider adding Zetia if LDL levels are not adequately controlled. Monitor lipid levels and adjust treatment as necessary. If LDL is not significantly improved to <70, will consider addition of PCSK9 inhibitors.  I will manage his lipids and once stable we will release him back to his PCP for further management and evaluation.  Patient used to work as a Public relations account executive, he recently retired from [employer].  Signed,  Knox Perl, MD, [facility] 04/25/2023, 6:09 AM [facility] [address] [place], [place] [address] Phone: [phone]. Fax:  [phone]

## 2023-04-25 ENCOUNTER — Encounter: Payer: Self-pay | Admitting: Cardiology

## 2023-05-09 ENCOUNTER — Encounter: Payer: Self-pay | Admitting: Cardiology

## 2023-05-09 LAB — LIPID PANEL
Chol/HDL Ratio: 2.3 ratio (ref 0.0–5.0)
Cholesterol, Total: 121 mg/dL (ref 100–199)
HDL: 52 mg/dL (ref >39)
LDL Chol Calc (NIH): 49 mg/dL (ref 0–99)
Triglycerides: 111 mg/dL (ref 0–149)
VLDL Cholesterol Cal: 20 mg/dL (ref 5–40)

## 2023-08-19 ENCOUNTER — Other Ambulatory Visit: Payer: Self-pay | Admitting: Cardiology

## 2023-12-06 ENCOUNTER — Other Ambulatory Visit: Payer: Self-pay | Admitting: Cardiology
